# Patient Record
Sex: Male | Born: 1945 | Race: Black or African American | Hispanic: No | Marital: Married | Smoking: Current every day smoker
Health system: Southern US, Community
[De-identification: ages and names within clinical notes are randomized; demographics above are authoritative.]

## PROBLEM LIST (undated history)

## (undated) DIAGNOSIS — E785 Hyperlipidemia, unspecified: Secondary | ICD-10-CM

## (undated) DIAGNOSIS — M1990 Unspecified osteoarthritis, unspecified site: Secondary | ICD-10-CM

## (undated) DIAGNOSIS — M109 Gout, unspecified: Secondary | ICD-10-CM

## (undated) DIAGNOSIS — I519 Heart disease, unspecified: Secondary | ICD-10-CM

## (undated) DIAGNOSIS — I10 Essential (primary) hypertension: Secondary | ICD-10-CM

## (undated) DIAGNOSIS — F32A Depression, unspecified: Secondary | ICD-10-CM

## (undated) DIAGNOSIS — I639 Cerebral infarction, unspecified: Secondary | ICD-10-CM

## (undated) DIAGNOSIS — F419 Anxiety disorder, unspecified: Secondary | ICD-10-CM

## (undated) DIAGNOSIS — H409 Unspecified glaucoma: Secondary | ICD-10-CM

## (undated) DIAGNOSIS — F329 Major depressive disorder, single episode, unspecified: Secondary | ICD-10-CM

## (undated) HISTORY — DX: Anxiety disorder, unspecified: F41.9

## (undated) HISTORY — DX: Heart disease, unspecified: I51.9

## (undated) HISTORY — DX: Gout, unspecified: M10.9

## (undated) HISTORY — DX: Hyperlipidemia, unspecified: E78.5

## (undated) HISTORY — DX: Unspecified glaucoma: H40.9

---

## 1977-03-24 HISTORY — PX: LEG SURGERY: SHX1003

## 2005-11-09 ENCOUNTER — Emergency Department: Payer: Self-pay | Admitting: Emergency Medicine

## 2005-11-09 ENCOUNTER — Other Ambulatory Visit: Payer: Self-pay

## 2011-05-27 DIAGNOSIS — H2510 Age-related nuclear cataract, unspecified eye: Secondary | ICD-10-CM | POA: Insufficient documentation

## 2011-05-27 DIAGNOSIS — H401190 Primary open-angle glaucoma, unspecified eye, stage unspecified: Secondary | ICD-10-CM | POA: Insufficient documentation

## 2011-05-27 DIAGNOSIS — H409 Unspecified glaucoma: Secondary | ICD-10-CM | POA: Insufficient documentation

## 2012-11-03 DIAGNOSIS — I10 Essential (primary) hypertension: Secondary | ICD-10-CM | POA: Insufficient documentation

## 2012-11-03 DIAGNOSIS — E7800 Pure hypercholesterolemia, unspecified: Secondary | ICD-10-CM | POA: Insufficient documentation

## 2012-11-03 DIAGNOSIS — F329 Major depressive disorder, single episode, unspecified: Secondary | ICD-10-CM | POA: Insufficient documentation

## 2013-05-26 DIAGNOSIS — I639 Cerebral infarction, unspecified: Secondary | ICD-10-CM | POA: Insufficient documentation

## 2013-05-26 DIAGNOSIS — K219 Gastro-esophageal reflux disease without esophagitis: Secondary | ICD-10-CM | POA: Insufficient documentation

## 2013-10-17 DIAGNOSIS — G723 Periodic paralysis: Secondary | ICD-10-CM | POA: Insufficient documentation

## 2013-10-17 DIAGNOSIS — M7500 Adhesive capsulitis of unspecified shoulder: Secondary | ICD-10-CM | POA: Insufficient documentation

## 2013-10-17 DIAGNOSIS — Z8781 Personal history of (healed) traumatic fracture: Secondary | ICD-10-CM | POA: Insufficient documentation

## 2014-04-27 DIAGNOSIS — R350 Frequency of micturition: Secondary | ICD-10-CM | POA: Insufficient documentation

## 2015-02-22 ENCOUNTER — Encounter: Payer: Self-pay | Admitting: *Deleted

## 2015-02-22 ENCOUNTER — Emergency Department
Admission: EM | Admit: 2015-02-22 | Discharge: 2015-02-22 | Disposition: A | Discharge status: Home / Self Care | Payer: Medicare Other | Attending: Emergency Medicine | Admitting: Emergency Medicine

## 2015-02-22 DIAGNOSIS — R350 Frequency of micturition: Secondary | ICD-10-CM | POA: Diagnosis present

## 2015-02-22 DIAGNOSIS — R109 Unspecified abdominal pain: Secondary | ICD-10-CM | POA: Insufficient documentation

## 2015-02-22 DIAGNOSIS — R358 Other polyuria: Secondary | ICD-10-CM | POA: Diagnosis not present

## 2015-02-22 DIAGNOSIS — I10 Essential (primary) hypertension: Secondary | ICD-10-CM | POA: Diagnosis not present

## 2015-02-22 DIAGNOSIS — F17200 Nicotine dependence, unspecified, uncomplicated: Secondary | ICD-10-CM | POA: Insufficient documentation

## 2015-02-22 HISTORY — DX: Major depressive disorder, single episode, unspecified: F32.9

## 2015-02-22 HISTORY — DX: Essential (primary) hypertension: I10

## 2015-02-22 HISTORY — DX: Cerebral infarction, unspecified: I63.9

## 2015-02-22 HISTORY — DX: Depression, unspecified: F32.A

## 2015-02-22 HISTORY — DX: Unspecified osteoarthritis, unspecified site: M19.90

## 2015-02-22 LAB — BASIC METABOLIC PANEL
ANION GAP: 6 (ref 5–15)
BUN: 14 mg/dL (ref 6–20)
CALCIUM: 9.1 mg/dL (ref 8.9–10.3)
CO2: 25 mmol/L (ref 22–32)
Chloride: 112 mmol/L — ABNORMAL HIGH (ref 101–111)
Creatinine, Ser: 1.25 mg/dL — ABNORMAL HIGH (ref 0.61–1.24)
GFR calc non Af Amer: 57 mL/min — ABNORMAL LOW (ref >60)
GLUCOSE: 104 mg/dL — AB (ref 65–99)
POTASSIUM: 4 mmol/L (ref 3.5–5.1)
Sodium: 143 mmol/L (ref 135–145)

## 2015-02-22 LAB — CBC
HEMATOCRIT: 39.8 % — AB (ref 40.0–52.0)
HEMOGLOBIN: 13.2 g/dL (ref 13.0–18.0)
MCH: 29.5 pg (ref 26.0–34.0)
MCHC: 33.2 g/dL (ref 32.0–36.0)
MCV: 89 fL (ref 80.0–100.0)
Platelets: 207 10*3/uL (ref 150–440)
RBC: 4.47 MIL/uL (ref 4.40–5.90)
RDW: 13.8 % (ref 11.5–14.5)
WBC: 6.2 10*3/uL (ref 3.8–10.6)

## 2015-02-22 LAB — URINALYSIS COMPLETE WITH MICROSCOPIC (ARMC ONLY)
BACTERIA UA: NONE SEEN
Bilirubin Urine: NEGATIVE
GLUCOSE, UA: NEGATIVE mg/dL
Ketones, ur: NEGATIVE mg/dL
Leukocytes, UA: NEGATIVE
Nitrite: NEGATIVE
PH: 5 (ref 5.0–8.0)
SQUAMOUS EPITHELIAL / LPF: NONE SEEN
Specific Gravity, Urine: 1.02 (ref 1.005–1.030)

## 2015-02-22 NOTE — ED Notes (Signed)
Pt states having difficulty urinating with "dribbling" as well. Pt states seen prior at other hospitals but unsure of diagnosis.

## 2015-02-22 NOTE — ED Notes (Signed)
Right back pain, slight burning with urination

## 2015-02-22 NOTE — ED Provider Notes (Signed)
[facility] Emergency Department Provider Note  ____________________________________________  Time seen: Approximately 3:15 PM  I have reviewed the triage vital signs and the nursing notes.   HISTORY  Chief Complaint Urinary Frequency    HPI Maurice Sullivan is a 69 y.o. male with primary medical history that includes hypertension and 3 prior CVAs who presents with complaint of urinary frequency for several months.  He has been see his primary care doctor several times and they do not yet have a diagnosis.  He denies fever/chills, chest pain, shortness of breath, abdominal pain, dysuria.  He states that sometimes he dribbles a little bit.  He occasionally has some pain in his right flank which he describes as aching, mild, and intermittent.Thing seems to make his symptoms better and nothing makes them worse.   Past Medical History  Diagnosis Date  . Hypertension   . Depression   . CVA (cerebral infarction)   . Arthritis     There are no active problems to display for this patient.   History reviewed. No pertinent past surgical history.  No current outpatient prescriptions on file.  Allergies Review of patient's allergies indicates no known allergies.  No family history on file.  Social History Social History  Substance Use Topics  . Smoking status: Current Every Day Smoker  . Smokeless tobacco: None  . Alcohol Use: No    Review of Systems Constitutional: No fever/chills Eyes: No visual changes. ENT: No sore throat. Cardiovascular: Denies chest pain. Respiratory: Denies shortness of breath. Gastrointestinal: No abdominal pain with intermittent right-sided flank pain.  No nausea, no vomiting.  No diarrhea.  No constipation. Genitourinary: Negative for dysuria.  +Polyuria. Musculoskeletal: Negative for back pain. Skin: Negative for rash. Neurological: Negative for headaches, focal weakness or numbness.  10-point ROS otherwise  negative.  ____________________________________________   PHYSICAL EXAM:  VITAL SIGNS: ED Triage Vitals  Enc Vitals Group     BP 02/22/15 1233 172/46 mmHg     Pulse Rate 02/22/15 1233 76     Resp 02/22/15 1233 18     Temp 02/22/15 1233 97.8 F (36.6 C)     Temp Source 02/22/15 1233 Oral     SpO2 02/22/15 1233 99 %     Weight 02/22/15 1233 168 lb (76.204 kg)     Height 02/22/15 1233  (1.803 m)     Head Cir --      Peak Flow --      Pain Score 02/22/15 1238 5     Pain Loc --      Pain Edu? --      Excl. in GC? --     Constitutional: Alert and oriented. Well appearing and in no acute distress. Eyes: Conjunctivae are normal. PERRL. EOMI. Head: Atraumatic. Nose: No congestion/rhinnorhea. Mouth/Throat: Mucous membranes are moist.  Oropharynx non-erythematous. Neck: No stridor.   Cardiovascular: Normal rate, regular rhythm. Grossly normal heart sounds.  Good peripheral circulation. Respiratory: Normal respiratory effort.  No retractions. Lungs CTAB. Gastrointestinal: Soft and nontender. No distention. No abdominal bruits. No CVA tenderness. Musculoskeletal: No lower extremity tenderness nor edema.  No joint effusions. Neurologic:  Normal speech and language. No gross focal neurologic deficits are appreciated.  Skin:  Skin is warm, dry and intact. No rash noted. Psychiatric: Mood and affect are normal. Speech and behavior are normal.  ____________________________________________   LABS (all labs ordered are listed, but only abnormal results are displayed)  Labs Reviewed  CBC - Abnormal; Notable for the  following:    HCT 39.8 (*)    All other components within normal limits  BASIC METABOLIC PANEL - Abnormal; Notable for the following:    Chloride 112 (*)    Glucose, Bld 104 (*)    Creatinine, Ser 1.25 (*)    GFR calc non Af Amer 57 (*)    All other components within normal limits  URINALYSIS COMPLETEWITH MICROSCOPIC ([facility] ONLY) - Abnormal; Notable for the  following:    Color, Urine YELLOW (*)    APPearance CLEAR (*)    Hgb urine dipstick 1+ (*)    Protein, ur >500 (*)    All other components within normal limits   ____________________________________________  EKG  Not indicated ____________________________________________  RADIOLOGY   No results found.  ____________________________________________   PROCEDURES  Procedure(s) performed: None  Critical Care performed: No ____________________________________________   INITIAL IMPRESSION / ASSESSMENT AND PLAN / ED COURSE  Pertinent labs & imaging results that were available during my care of the patient were reviewed by me and considered in my medical decision making (see chart for details).  The patient is well-appearing and in no acute distress with normal vital signs except for hypertension.  His urinalysis is unremarkable except for small amount of hemoglobin but he has no red cells or white cells and no evidence of infection.  I discussed the patient's symptoms with him and his wife fairly extensively.  I offered imaging including a CT scan renal study protocol, but they are not interested today, and I agree it is not particularly necessary or indicated.  They are most interested in being able to follow up with the urologist so I provided them that contact information.  The patient also declined a rectal exam today.  Again I think this is appropriate as it is unlikely to change his diagnosis or disposition.  I gave my usual and customary return precautions.     ____________________________________________  FINAL CLINICAL IMPRESSION(S) / ED DIAGNOSES  Final diagnoses:  Urinary frequency      NEW MEDICATIONS STARTED DURING THIS VISIT:  New Prescriptions   No medications on file     Loleta Roseory Mailey Landstrom, MD 02/22/15 1623

## 2015-02-22 NOTE — Discharge Instructions (Signed)
As we discussed, your workup today was reassuring.  Though we do not know exactly what is causing your symptoms, it appears that you have no emergent medical condition at this time are safe to go home and follow up as recommended in this paperwork.  Please return immediately to the Emergency Department if you develop any new or worsening symptoms that concern you.   Urinary Frequency The number of times a normal person urinates depends upon how much liquid they take in and how much liquid they are losing. If the temperature is hot and there is high humidity, then the person will sweat more and usually breathe a little more frequently. These factors decrease the amount of frequency of urination that would be considered normal. The amount you drink is easily determined, but the amount of fluid lost is sometimes more difficult to calculate.  Fluid is lost in two ways:  Sensible fluid loss is usually measured by the amount of urine that you get rid of. Losses of fluid can also occur with diarrhea.  Insensible fluid loss is more difficult to measure. It is caused by evaporation. Insensible loss of fluid occurs through breathing and sweating. It usually ranges from a little less than a quart to a little more than a quart of fluid a day. In normal temperatures and activity levels, the average person may urinate 4 to 7 times in a 24-hour period. Needing to urinate more often than that could indicate a problem. If one urinates 4 to 7 times in 24 hours and has large volumes each time, that could indicate a different problem from one who urinates 4 to 7 times a day and has small volumes. The time of urinating is also important. Most urinating should be done during the waking hours. Getting up at night to urinate frequently can indicate some problems. CAUSES  The bladder is the organ in your lower abdomen that holds urine. Like a balloon, it swells some as it fills up. Your nerves sense this and tell you it is  time to head for the bathroom. There are a number of reasons that you might feel the need to urinate more often than usual. They include:  Urinary tract infection. This is usually associated with other signs such as burning when you urinate.  In men, problems with the prostate (a walnut-size gland that is located near the tube that carries urine out of your body). There are two reasons why the prostate can cause an increased frequency of urination:  An enlarged prostate that does not let the bladder empty well. If the bladder only half empties when you urinate, then it only has half the capacity to fill before you have to urinate again.  The nerves in the bladder become more hypersensitive with an increased size of the prostate even if the bladder empties completely.  Pregnancy.  Obesity. Excess weight is more likely to cause a problem for women than for men.  Bladder stones or other bladder problems.  Caffeine.  Alcohol.  Medications. For example, drugs that help the body get rid of extra fluid (diuretics) increase urine production. Some other medicines must be taken with lots of fluids.  Muscle or nerve weakness. This might be the result of a spinal cord injury, a stroke, multiple sclerosis, or Parkinson disease.  Long-standing diabetes can decrease the sensation of the bladder. This loss of sensation makes it harder to sense the bladder needs to be emptied. Over a period of years, the bladder is  stretched out by constant overfilling. This weakens the bladder muscles so that the bladder does not empty well and has less capacity to fill with new urine.  Interstitial cystitis (also called painful bladder syndrome). This condition develops because the tissues that line the inside of the bladder are inflamed (inflammation is the body's way of reacting to injury or infection). It causes pain and frequent urination. It occurs in women more often than in men. DIAGNOSIS   To decide what might  be causing your urinary frequency, your health care provider will probably:  Ask about symptoms you have noticed.  Ask about your overall health. This will include questions about any medications you are taking.  Do a physical examination.  Order some tests. These might include:  A blood test to check for diabetes or other health issues that could be contributing to the problem.  Urine testing. This could measure the flow of urine and the pressure on the bladder.  A test of your neurological system (the brain, spinal cord, and nerves). This is the system that senses the need to urinate.  A bladder test to check whether it is emptying completely when you urinate.  Cystoscopy. This test uses a thin tube with a tiny camera on it. It offers a look inside your urethra and bladder to see if there are problems.  Imaging tests. You might be given a contrast dye and then asked to urinate. X-rays are taken to see how your bladder is working. TREATMENT  It is important for you to be evaluated to determine if the amount or frequency that you have is unusual or abnormal. If it is found to be abnormal, the cause should be determined and this can usually be found out easily. Depending upon the cause, treatment could include medication, stimulation of the nerves, or surgery. There are not too many things that you can do as an individual to change your urinary frequency. It is important that you balance the amount of fluid intake needed to compensate for your activity and the temperature. Medical problems will be diagnosed and taken care of by your physician. There is no particular bladder training such as Kegel exercises that you can do to help urinary frequency. This is an exercise that is usually recommended for people who have leaking of urine when they laugh, cough, or sneeze. HOME CARE INSTRUCTIONS   Take any medications your health care provider prescribed or suggested. Follow the directions  carefully.  Practice any lifestyle changes that are recommended. These might include:  Drinking less fluid or drinking at different times of the day. If you need to urinate often during the night, for example, you may need to stop drinking fluids early in the evening.  Cutting down on caffeine or alcohol. They both can make you need to urinate more often than normal. Caffeine is found in coffee, tea, and sodas.  Losing weight, if that is recommended.  Keep a journal or a log. You might be asked to record how much you drink and when and where you feel the need to urinate. This will also help evaluate how well the treatment provided by your physician is working. SEEK MEDICAL CARE IF:   Your need to urinate often gets worse.  You feel increased pain or irritation when you urinate.  You notice blood in your urine.  You have questions about any medications that your health care provider recommended.  You notice blood, pus, or swelling at the site of any test  or treatment procedure.  You develop a fever of more than 100.74F (38.1C). SEEK IMMEDIATE MEDICAL CARE IF:  You develop a fever of more than 102.21F (38.9C).   This information is not intended to replace advice given to you by your health care provider. Make sure you discuss any questions you have with your health care provider.   Document Released: 01/04/2009 Document Revised: 03/31/2014 Document Reviewed: 01/04/2009 Elsevier Interactive Patient Education [employer]

## 2015-03-05 ENCOUNTER — Encounter: Payer: Self-pay | Admitting: Urology

## 2015-03-05 ENCOUNTER — Ambulatory Visit (INDEPENDENT_AMBULATORY_CARE_PROVIDER_SITE_OTHER): Payer: Medicare Other | Admitting: Urology

## 2015-03-05 VITALS — BP 190/64 | HR 71 | Ht 71.0 in | Wt 162.7 lb

## 2015-03-05 DIAGNOSIS — R3915 Urgency of urination: Secondary | ICD-10-CM

## 2015-03-05 DIAGNOSIS — Z125 Encounter for screening for malignant neoplasm of prostate: Secondary | ICD-10-CM

## 2015-03-05 DIAGNOSIS — N400 Enlarged prostate without lower urinary tract symptoms: Secondary | ICD-10-CM

## 2015-03-05 DIAGNOSIS — R350 Frequency of micturition: Secondary | ICD-10-CM

## 2015-03-05 LAB — URINALYSIS, COMPLETE
BILIRUBIN UA: NEGATIVE
GLUCOSE, UA: NEGATIVE
KETONES UA: NEGATIVE
Leukocytes, UA: NEGATIVE
NITRITE UA: NEGATIVE
UUROB: 1 mg/dL (ref 0.2–1.0)
pH, UA: 5 (ref 5.0–7.5)

## 2015-03-05 LAB — MICROSCOPIC EXAMINATION: EPITHELIAL CELLS (NON RENAL): NONE SEEN /HPF (ref 0–10)

## 2015-03-05 LAB — BLADDER SCAN AMB NON-IMAGING

## 2015-03-05 MED ORDER — TAMSULOSIN HCL 0.4 MG PO CAPS: 0.4000 mg | ORAL_CAPSULE | Freq: Every day | ORAL | Status: DC

## 2015-03-05 NOTE — Progress Notes (Signed)
03/05/2015 6:27 PM   Maurice Sullivan Aug 08, 1945 [id number]  Referring provider: Ellery Plunk, MD [address] [facility] [address] [place], [place] [address]  Chief Complaint  Patient presents with  . Urinary Frequency    New Patient    HPI: 69 yo M referred from the ED for worsening urinary symptoms.  Patient reports a 3 month history of worsening urinary symptoms. Prior to 3 months ago he had very few urinary issues. Over the past 3 months, he is developed urinary frequency, nocturia 5, difficulty emptying his bladder, post void dribbling. He is seen several doctors including his PCP and the emergency room. His urine has been tested for urinary tract infection although these tests were negative.  He also endorses today episodes of urge incontinence as well as lower abdominal pain.  No gross hematuria. No dysuria.  He is not currently on any medications for his prostate.  He has not had any PSA/ DRE per the patient by his PCP.  No recent PSA data and care everywhere.  No family history of prostate cancer.  He does have multiple medical problems including history of tobacco abuse and stroke.  Post void residual residual today is minimal.       IPSS      03/05/15 1500       International Prostate Symptom Score   How often have you had the sensation of not emptying your bladder? Almost always     How often have you had to urinate less than every two hours? Almost always     How often have you found you stopped and started again several times when you urinated? More than half the time     How often have you found it difficult to postpone urination? Almost always     How often have you had a weak urinary stream? Almost always     How often have you had to strain to start urination? Almost always     How many times did you typically get up at night to urinate? 5 Times     Total IPSS Score 34     Quality of Life due to urinary symptoms   If you  were to spend the rest of your life with your urinary condition just the way it is now how would you feel about that? Terrible         PMH: Past Medical History  Diagnosis Date  . Hypertension   . Depression   . CVA (cerebral infarction)   . Arthritis   . Anxiety   . Glaucoma   . Gout   . Heart disease   . Hyperlipemia     Surgical History: Past Surgical History  Procedure Laterality Date  . Leg surgery  1979    Home Medications:    Medication List       This list is accurate as of: 03/05/15  6:27 PM.  Always use your most recent med list.               lisinopril 10 MG tablet  Commonly known as:  PRINIVIL,ZESTRIL  Take 10 mg by mouth daily.     metoprolol tartrate 25 MG tablet  Commonly known as:  LOPRESSOR  Take 25 mg by mouth 2 (two) times daily.     pravastatin 20 MG tablet  Commonly known as:  PRAVACHOL  Take 20 mg by mouth daily.     sertraline 100 MG tablet  Commonly  known as:  ZOLOFT  Take 100 mg by mouth daily.     tamsulosin 0.4 MG Caps capsule  Commonly known as:  FLOMAX  Take 1 capsule (0.4 mg total) by mouth daily.        Allergies: No Known Allergies  Family History: Family History  Problem Relation Age of Onset  . Prostate cancer Brother     Social History:  reports that he has been smoking Cigarettes.  He has been smoking about 1.00 pack per day. He does not have any smokeless tobacco history on file. He reports that he drinks alcohol. He reports that he does not use illicit drugs.  ROS: UROLOGY Frequent Urination?: Yes Hard to postpone urination?: Yes Burning/pain with urination?: Yes Get up at night to urinate?: Yes Leakage of urine?: Yes Urine stream starts and stops?: Yes Trouble starting stream?: Yes Do you have to strain to urinate?: Yes Blood in urine?: No Urinary tract infection?: No Sexually transmitted disease?: No Injury to kidneys or bladder?: No Painful intercourse?: No Weak stream?: Yes Erection  problems?: Yes Penile pain?: No  Gastrointestinal Nausea?: No Vomiting?: No Indigestion/heartburn?: No Diarrhea?: No Constipation?: No  Constitutional Fever: No Night sweats?: Yes Weight loss?: Yes Fatigue?: Yes  Skin Skin rash/lesions?: No Itching?: No  Eyes Blurred vision?: Yes Double vision?: No  Ears/Nose/Throat Sore throat?: Yes Sinus problems?: No  Hematologic/Lymphatic Swollen glands?: No Easy bruising?: No  Cardiovascular Leg swelling?: No Chest pain?: No  Respiratory Cough?: No Shortness of breath?: No  Endocrine Excessive thirst?: Yes  Musculoskeletal Back pain?: Yes Joint pain?: No  Neurological Headaches?: Yes Dizziness?: No  Psychologic Depression?: Yes Anxiety?: Yes  Physical Exam: BP 190/64 mmHg  Pulse 71  Ht  (1.803 m)  Wt 162 lb 11.2 oz (73.8 kg)  BMI 22.70 kg/m2  Constitutional:  Alert and oriented, No acute distress. HEENT: [place] AT, moist mucus membranes.  Trachea midline, no masses. Cardiovascular: No clubbing, cyanosis, or edema. Respiratory: Normal respiratory effort, no increased work of breathing. GI: Abdomen is soft, nontender, nondistended, no abdominal masses GU: No CVA tenderness. Circumcised phallus with orthotopic meatus.  Right testicle absent, left testicle present, no masses. Unremarkable scrotum. Rectal exam: Normal sphincter tone. Enlarged 50+ cc prostate, no masses, nontender. Skin: No rashes, bruises or suspicious lesions. Lymph: No cervical or inguinal adenopathy. Neurologic: Grossly intact, no focal deficits, moving all 4 extremities. Psychiatric: Normal mood and affect.  Laboratory Data: Lab Results  Component Value Date   WBC 6.2 02/22/2015   HGB 13.2 02/22/2015   HCT 39.8* 02/22/2015   MCV 89.0 02/22/2015   PLT 207 02/22/2015    Lab Results  Component Value Date   CREATININE 1.25* 02/22/2015     Urinalysis UA today Shows 1+ blood, 3+ protein otherwise negative. Microscopic exam  shows no white blood cells or red blood cells. There is mucus present. Few bacteria. No suspicion for infection.  Pertinent Imaging: Results for orders placed or performed in visit on 03/05/15  Microscopic Examination  Result Value Ref Range   WBC, UA 0-5 0 -  5 /hpf   RBC, UA 0-2 0 -  2 /hpf   Epithelial Cells (non renal) None seen 0 - 10 /hpf   Mucus, UA Present (A) Not Estab.   Bacteria, UA Few (A) None seen/Few  Urinalysis, Complete  Result Value Ref Range   Specific Gravity, UA >1.030 (H) 1.005 - 1.030   pH, UA 5.0 5.0 - 7.5   Color, UA Yellow Yellow  Appearance Ur Clear Clear   Leukocytes, UA Negative Negative   Protein, UA 3+ (A) Negative/Trace   Glucose, UA Negative Negative   Ketones, UA Negative Negative   RBC, UA 1+ (A) Negative   Bilirubin, UA Negative Negative   Urobilinogen, Ur 1.0 0.2 - 1.0 mg/dL   Nitrite, UA Negative Negative   Microscopic Examination See below:   BLADDER SCAN AMB NON-IMAGING  Result Value Ref Range   Scan Result 0ml     Assessment & Plan:   69 year old male with worsening urinary symptoms including urinary urgency, frequency, and urge incontinence. He also has a known large gland on exam today consistent with BPH.  I will like to go ahead and start him on Flomax to start and have him return in approximately 6 weeks for reassessment. At that point, may consider addition of anticholinergic medication. He is at risk for OAB given his history of stroke.  No evidence of the UTI on UA today.  1. Urinary frequency - Urinalysis, Complete - BLADDER SCAN AMB NON-IMAGING - PSA  2. Urinary urgency - Urinalysis, Complete - BLADDER SCAN AMB NON-IMAGING  3. BPH (benign prostatic hyperplasia)  4. Prostate cancer screening Discussed risk and benefits of prostate cancer screening. Given his current worsening of symptoms, D fields. To rule out clinically significant prostate cancer. I advised him no further screening should this PSA returned negative  given his age and comorbidities.   Return in about 6 weeks (around 04/16/2015) for IPSS, symptoms recheck.  Vanna ScotlandAshley Etha Stambaugh, MD  [facility] [address], [address] [place], [place] [address] [phone]

## 2015-03-06 LAB — PSA: PROSTATE SPECIFIC AG, SERUM: 1.1 ng/mL (ref 0.0–4.0)

## 2015-03-07 ENCOUNTER — Telehealth: Payer: Self-pay

## 2015-03-07 NOTE — Telephone Encounter (Signed)
Spoke with pt wife in reference to PSA. Wife voiced understanding.

## 2015-03-07 NOTE — Telephone Encounter (Signed)
-----   Message from Ashley BrandonVanna Scotland, MD sent at 03/06/2015  5:24 PM EST ----- Please let this patient noted that his PSA is excellent, 1.1. I would not recommend any further PSAs.  Vanna ScotlandAshley Brandon, MD

## 2015-04-19 ENCOUNTER — Ambulatory Visit: Payer: Medicare Other | Admitting: Urology

## 2015-05-01 ENCOUNTER — Ambulatory Visit: Payer: Medicare Other | Admitting: Urology

## 2015-05-04 ENCOUNTER — Ambulatory Visit (INDEPENDENT_AMBULATORY_CARE_PROVIDER_SITE_OTHER): Payer: Medicare Other | Admitting: Urology

## 2015-05-04 ENCOUNTER — Encounter: Payer: Self-pay | Admitting: Urology

## 2015-05-04 VITALS — BP 175/71 | HR 69 | Ht 71.0 in | Wt 162.1 lb

## 2015-05-04 DIAGNOSIS — R351 Nocturia: Secondary | ICD-10-CM

## 2015-05-04 DIAGNOSIS — R350 Frequency of micturition: Secondary | ICD-10-CM

## 2015-05-04 DIAGNOSIS — R3915 Urgency of urination: Secondary | ICD-10-CM

## 2015-05-04 DIAGNOSIS — N400 Enlarged prostate without lower urinary tract symptoms: Secondary | ICD-10-CM

## 2015-05-04 LAB — URINALYSIS, COMPLETE
BILIRUBIN UA: NEGATIVE
Glucose, UA: NEGATIVE
LEUKOCYTES UA: NEGATIVE
Nitrite, UA: NEGATIVE
PH UA: 6 (ref 5.0–7.5)
Specific Gravity, UA: >1.03 — ABNORMAL HIGH (ref 1.005–1.030)
Urobilinogen, Ur: 1 mg/dL (ref 0.2–1.0)

## 2015-05-04 LAB — MICROSCOPIC EXAMINATION
Bacteria, UA: NONE SEEN
RENAL EPITHEL UA: NONE SEEN /HPF
WBC, UA: NONE SEEN /hpf (ref 0–?)

## 2015-05-04 LAB — BLADDER SCAN AMB NON-IMAGING: Scan Result: 77

## 2015-05-04 NOTE — Progress Notes (Signed)
2:41 PM  05/04/2015   Maurice Sullivan 1945-05-06 [id number]  Referring provider: Ellery Plunk, MD [address] [place], [place] [address]  Chief Complaint  Patient presents with  . Benign Prostatic Hypertrophy    6wk    HPI: 70 yo M with LUTS x 3 months.  He was started on Flomax last visit (2 months ago) which helped some with the frequency but not with the urgency or urge incontinence.   He continues to get up 6x nightly  His flow is slightly better as well.  His lower abdominal pain has also resolved.  Overall, he is still is quite unhappy.   No dysuria or gross hematuria.  He does think his urine is somewhat foul smelling.    Rectal exam enlarged last visit, PSA 1.1 on 03/06/15.  He does have multiple medical problems including history of tobacco abuse and stroke.  Post void residual today 77.         IPSS      03/05/15 1500 05/04/15 1300     International Prostate Symptom Score   How often have you had the sensation of not emptying your bladder? Almost always Almost always    How often have you had to urinate less than every two hours? Almost always About half the time    How often have you found you stopped and started again several times when you urinated? More than half the time About half the time    How often have you found it difficult to postpone urination? Almost always Almost always    How often have you had a weak urinary stream? Almost always Almost always    How often have you had to strain to start urination? Almost always Almost always    How many times did you typically get up at night to urinate? 5 Times 5 Times    Total IPSS Score 34 31    Quality of Life due to urinary symptoms   If you were to spend the rest of your life with your urinary condition just the way it is now how would you feel about that? Terrible Mostly Disatisfied        PMH: Past Medical History  Diagnosis Date  . Hypertension   . Depression   . CVA  (cerebral infarction)   . Arthritis   . Anxiety   . Glaucoma   . Gout   . Heart disease   . Hyperlipemia     Surgical History: Past Surgical History  Procedure Laterality Date  . Leg surgery  1979    Home Medications:    Medication List       This list is accurate as of: 05/04/15  2:41 PM.  Always use your most recent med list.               aspirin 81 MG chewable tablet  Chew by mouth.     lisinopril 10 MG tablet  Commonly known as:  PRINIVIL,ZESTRIL  Take 10 mg by mouth daily.     metoprolol tartrate 25 MG tablet  Commonly known as:  LOPRESSOR  Take 25 mg by mouth 2 (two) times daily.     pravastatin 20 MG tablet  Commonly known as:  PRAVACHOL  Take 20 mg by mouth daily.     sertraline 100 MG tablet  Commonly known as:  ZOLOFT  Take 100 mg by mouth daily.     tamsulosin 0.4 MG Caps capsule  Commonly  known as:  FLOMAX  Take 1 capsule (0.4 mg total) by mouth daily.        Allergies: No Known Allergies  Family History: Family History  Problem Relation Age of Onset  . Prostate cancer Brother     Social History:  reports that he has been smoking Cigarettes.  He has been smoking about 1.00 pack per day. He does not have any smokeless tobacco history on file. He reports that he drinks alcohol. He reports that he does not use illicit drugs.  ROS: UROLOGY Frequent Urination?: Yes Hard to postpone urination?: Yes Burning/pain with urination?: No Get up at night to urinate?: Yes Leakage of urine?: Yes Urine stream starts and stops?: Yes Trouble starting stream?: Yes Do you have to strain to urinate?: No Blood in urine?: No Urinary tract infection?: No Sexually transmitted disease?: No Injury to kidneys or bladder?: No Painful intercourse?: No Weak stream?: No Erection problems?: Yes Penile pain?: No  Gastrointestinal Nausea?: No Vomiting?: No Indigestion/heartburn?: No Diarrhea?: No Constipation?: Yes  Constitutional Fever: No Night  sweats?: Yes Weight loss?: No Fatigue?: Yes  Skin Skin rash/lesions?: No Itching?: No  Eyes Blurred vision?: Yes Double vision?: No  Ears/Nose/Throat Sore throat?: No Sinus problems?: No  Hematologic/Lymphatic Swollen glands?: No Easy bruising?: No  Cardiovascular Leg swelling?: No Chest pain?: No  Respiratory Cough?: Yes Shortness of breath?: No  Endocrine Excessive thirst?: No  Musculoskeletal Back pain?: No Joint pain?: No  Neurological Headaches?: No Dizziness?: No  Psychologic Depression?: Yes Anxiety?: No  Physical Exam: BP 175/71 mmHg  Pulse 69  Ht  (1.803 m)  Wt 162 lb 1.6 oz (73.528 kg)  BMI 22.62 kg/m2  Constitutional:  Alert and oriented, No acute distress. HEENT: [place] AT, moist mucus membranes.  Trachea midline, no masses. Cardiovascular: No clubbing, cyanosis, or edema. Respiratory: Normal respiratory effort, no increased work of breathing. GI: Abdomen is soft, nontender, nondistended, no abdominal masses Skin: No rashes, bruises or suspicious lesions. Neurologic: Grossly intact, no focal deficits, moving all 4 extremities. Psychiatric: Normal mood and affect.  Laboratory Data: Lab Results  Component Value Date   WBC 6.2 02/22/2015   HGB 13.2 02/22/2015   HCT 39.8* 02/22/2015   MCV 89.0 02/22/2015   PLT 207 02/22/2015    Lab Results  Component Value Date   CREATININE 1.25* 02/22/2015    Urinalysis UA reviewed, 3+ prot,1+ ket, trace blod.  Micro negative other than 3-10 RBC/ HFP.  Pertinent Imaging: Results for orders placed or performed in visit on 05/04/15  BLADDER SCAN AMB NON-IMAGING  Result Value Ref Range   Scan Result 77     Assessment & Plan:     1. Urinary frequency On Flomax Will add vesicare 10 mg (samples give x 2 weeks), advised to call if effective and will prescribe or adjust as needed Reviewed common side effects including dry eyes, dry mouth, and constipation along with retention - Urinalysis,  Complete - BLADDER SCAN AMB NON-IMAGING  2. Urinary urgency As above  3. BPH (benign prostatic hyperplasia) On Flomax S/p PCa screening 02/2015   4. Microscopic hematuria Incidental 3-10 RBC today in absence of infection Will recheck in 3 months to assess need for work up  Return in about 3 months (around 08/01/2015) for PVR, recheck symptoms.  Vanna Scotland, MD  [facility] [address] [place], [place] [address] [phone]

## 2015-05-15 ENCOUNTER — Telehealth: Payer: Self-pay | Admitting: Urology

## 2015-05-15 NOTE — Telephone Encounter (Signed)
Patient's wife called and said that her husband was still dribbling a little since he has been on the vesicare you gave him.   Just wanted you to know.  Thanks,  [name]

## 2015-06-28 DIAGNOSIS — N138 Other obstructive and reflux uropathy: Secondary | ICD-10-CM | POA: Insufficient documentation

## 2015-06-28 DIAGNOSIS — N401 Enlarged prostate with lower urinary tract symptoms: Principal | ICD-10-CM

## 2015-07-27 ENCOUNTER — Telehealth: Payer: Self-pay

## 2015-07-27 NOTE — Telephone Encounter (Signed)
Pt wife called stating pt does not have a f/u appt until June and wanted to try the vesicare again. Vesicare 10mg  samples were provided as per Dr. Apolinar JunesBrandon dictation.

## 2015-08-03 ENCOUNTER — Ambulatory Visit: Payer: Medicare Other | Admitting: Urology

## 2015-08-24 ENCOUNTER — Ambulatory Visit (INDEPENDENT_AMBULATORY_CARE_PROVIDER_SITE_OTHER): Payer: Medicare Other | Admitting: Urology

## 2015-08-24 ENCOUNTER — Encounter: Payer: Self-pay | Admitting: Urology

## 2015-08-24 VITALS — BP 152/68 | HR 78 | Ht 70.0 in | Wt 152.0 lb

## 2015-08-24 DIAGNOSIS — N529 Male erectile dysfunction, unspecified: Secondary | ICD-10-CM

## 2015-08-24 DIAGNOSIS — N138 Other obstructive and reflux uropathy: Secondary | ICD-10-CM

## 2015-08-24 DIAGNOSIS — R350 Frequency of micturition: Secondary | ICD-10-CM

## 2015-08-24 DIAGNOSIS — N401 Enlarged prostate with lower urinary tract symptoms: Secondary | ICD-10-CM

## 2015-08-24 DIAGNOSIS — N3281 Overactive bladder: Secondary | ICD-10-CM | POA: Diagnosis not present

## 2015-08-24 DIAGNOSIS — R3129 Other microscopic hematuria: Secondary | ICD-10-CM

## 2015-08-24 LAB — URINALYSIS, COMPLETE
Bilirubin, UA: NEGATIVE
Glucose, UA: NEGATIVE
Ketones, UA: NEGATIVE
Leukocytes, UA: NEGATIVE
Nitrite, UA: NEGATIVE
PH UA: 5 (ref 5.0–7.5)
Specific Gravity, UA: 1.025 (ref 1.005–1.030)
UUROB: 0.2 mg/dL (ref 0.2–1.0)

## 2015-08-24 LAB — MICROSCOPIC EXAMINATION: BACTERIA UA: NONE SEEN

## 2015-08-24 LAB — BLADDER SCAN AMB NON-IMAGING: SCAN RESULT: 20

## 2015-08-24 MED ORDER — SILDENAFIL CITRATE 100 MG PO TABS: 20.0000 mg | ORAL_TABLET | Freq: Every day | ORAL | Status: AC | PRN

## 2015-08-24 NOTE — Progress Notes (Signed)
3:09 PM  08/24/2015   Maurice Sullivan 1945/08/27 [id number]  Referring provider: Ellery PlunkKathleen K Barnhouse, MD [address] [place], [place] [id number]  Chief Complaint  Patient presents with  . Follow-up    BPH, microheamturia    HPI: 70 yo M with LUTS, OAB, h/o make a scopic hematuria 1 and ED.  BPH with LUTS/ OAB/ Urge Inconintence He was started on Flomax last visit on 12/16  which helped some with the frequency but not with the urgency or urge incontinence but continued to have nocturia x 6.  He was started on Vesicare 10 mg daily in February.  He continues to complain of difficulty postponing urination with urge incontinence x 3 daily  persistent nocturia 5,  and leakage of urine after voiding.   He does feel like the medication is helped slightly but not significantly.   No dysuria or gross hematuria. No UTIs.  His wife continues to report that his urine is foul smelling.  Rectal exam enlarged, PSA 1.1 on 03/06/15.  He does have multiple medical problems including history of tobacco abuse and stroke.  Post void residual today 20.    History of microscopic blood x 1 UA today negative for microscopic blood today.  It has been positive on 1 single occation  ED   He does have ED with difficulty maintaining and achieving an erection. He's never tried any PDE 5 inhibitors. He is interested in pursuing this. He has no troubles with orgasm or ejaculation. He has no contraindications for this medication.    PMH: Past Medical History  Diagnosis Date  . Hypertension   . Depression   . CVA (cerebral infarction)   . Arthritis   . Anxiety   . Glaucoma   . Gout   . Heart disease   . Hyperlipemia     Surgical History: Past Surgical History  Procedure Laterality Date  . Leg surgery  1979    Home Medications:    Medication List       This list is accurate as of: 08/24/15  3:09 PM.  Always use your most recent med list.               aspirin 81 MG  chewable tablet  Chew by mouth.     dorzolamide-timolol 22.3-6.8 MG/ML ophthalmic solution  Commonly known as:  COSOPT     lisinopril 10 MG tablet  Commonly known as:  PRINIVIL,ZESTRIL  Take 10 mg by mouth daily.     metoprolol tartrate 25 MG tablet  Commonly known as:  LOPRESSOR  Take 25 mg by mouth 2 (two) times daily.     pravastatin 20 MG tablet  Commonly known as:  PRAVACHOL  Take 20 mg by mouth daily.     sildenafil 100 MG tablet  Commonly known as:  VIAGRA  Take 0.5 tablets (50 mg total) by mouth daily as needed for erectile dysfunction (Take 1-5 tablets as needed 1 hour prior to sexual activity).     solifenacin 10 MG tablet  Commonly known as:  VESICARE  Take 10 mg by mouth daily.        Allergies: No Known Allergies  Family History: Family History  Problem Relation Age of Onset  . Prostate cancer Brother     Social History:  reports that he has been smoking Cigarettes.  He has been smoking about 1.00 pack per day. He does not have any smokeless tobacco history on file. He reports that he drinks  alcohol. He reports that he does not use illicit drugs.  ROS: UROLOGY Frequent Urination?: No Hard to postpone urination?: Yes Burning/pain with urination?: No Get up at night to urinate?: Yes Leakage of urine?: Yes Urine stream starts and stops?: No Trouble starting stream?: No Do you have to strain to urinate?: No Blood in urine?: No Urinary tract infection?: Yes Sexually transmitted disease?: No Injury to kidneys or bladder?: No Painful intercourse?: No Weak stream?: No Erection problems?: No Penile pain?: No  Gastrointestinal Nausea?: No Vomiting?: No Indigestion/heartburn?: No Diarrhea?: No Constipation?: No  Constitutional Fever: No Night sweats?: No Weight loss?: No Fatigue?: No  Skin Skin rash/lesions?: No Itching?: No  Eyes Blurred vision?: No Double vision?: No  Ears/Nose/Throat Sore throat?: No Sinus problems?:  No  Hematologic/Lymphatic Swollen glands?: No Easy bruising?: No  Cardiovascular Leg swelling?: No Chest pain?: No  Respiratory Cough?: No Shortness of breath?: No  Endocrine Excessive thirst?: No  Musculoskeletal Back pain?: No Joint pain?: No  Neurological Headaches?: No Dizziness?: No  Psychologic Depression?: No Anxiety?: No  Physical Exam: BP 152/68 mmHg  Pulse 78  Ht  (1.778 m)  Wt 152 lb (68.947 kg)  BMI 21.81 kg/m2  Constitutional:  Alert and oriented, No acute distress.  Company by his wife today. HEENT: [place] AT, moist mucus membranes.  Trachea midline, no masses. Cardiovascular: No clubbing, cyanosis, or edema. Respiratory: Normal respiratory effort, no increased work of breathing. GI: Abdomen is soft, nontender, nondistended, no abdominal masses Skin: No rashes, bruises or suspicious lesions. Neurologic: Grossly intact, no focal deficits, moving all 4 extremities. Psychiatric: Normal mood and affect.  Laboratory Data: Lab Results  Component Value Date   WBC 6.2 02/22/2015   HGB 13.2 02/22/2015   HCT 39.8* 02/22/2015   MCV 89.0 02/22/2015   PLT 207 02/22/2015    Lab Results  Component Value Date   CREATININE 1.25* 02/22/2015    Urinalysis UA reviewed, negative for any blood, WBCs, or bacteria. Nitrate negative. 2+ protein.  Pertinent Imaging: Results for orders placed or performed in visit on 08/24/15  Bladder Scan (Post Void Residual) in office  Result Value Ref Range   Scan Result 20     Assessment & Plan:     1. Urinary frequency Today's to have bladder overactivity including daytime urge incontinence and severe nocturia. Will change medication to Mybetriq 25 mg. 2 weeks' worth of samples given today. Patient advised to call if this is not effective. May increase dose to 50 mg.  If no affect, we'll need to see in the office sooner. - Urinalysis, Complete - BLADDER SCAN AMB NON-IMAGING  2. Urinary urgency As above  3.  BPH (benign prostatic hyperplasia) S/p PCa screening 02/2015   Symptoms primarily irritative  4. Microscopic hematuria Incidental 3-10 RBC today in absence of infection in 04/2014 Does not yet meet the criteria for microscopic work up, will continue to follow  5. ED  Prescribed Viagra 20 mg, we'll start with one tablet and increased up to 5 tablets as needed Discuss common side effects of medication and how to use this appropriately  Return in about 6 months (around 02/23/2016) for PVR/ IPSS/ UA.  Vanna Scotland, MD  [facility] [address], [address] [place], [place] [address] [phone]  I spent 25 min with this patient of which greater than 50% was spent in counseling and coordination of care with the patient.

## 2016-02-22 ENCOUNTER — Ambulatory Visit: Payer: Medicare Other | Admitting: Urology

## 2017-03-12 ENCOUNTER — Emergency Department: Payer: Medicare Other

## 2017-03-12 ENCOUNTER — Emergency Department
Admission: EM | Admit: 2017-03-12 | Discharge: 2017-03-12 | Disposition: A | Discharge status: Home / Self Care | Payer: Medicare Other | Attending: Emergency Medicine | Admitting: Emergency Medicine

## 2017-03-12 ENCOUNTER — Other Ambulatory Visit: Payer: Self-pay

## 2017-03-12 ENCOUNTER — Encounter: Payer: Self-pay | Admitting: Emergency Medicine

## 2017-03-12 DIAGNOSIS — I10 Essential (primary) hypertension: Secondary | ICD-10-CM | POA: Insufficient documentation

## 2017-03-12 DIAGNOSIS — F17210 Nicotine dependence, cigarettes, uncomplicated: Secondary | ICD-10-CM | POA: Diagnosis not present

## 2017-03-12 DIAGNOSIS — Z79899 Other long term (current) drug therapy: Secondary | ICD-10-CM | POA: Diagnosis not present

## 2017-03-12 DIAGNOSIS — R42 Dizziness and giddiness: Secondary | ICD-10-CM | POA: Insufficient documentation

## 2017-03-12 DIAGNOSIS — Z7982 Long term (current) use of aspirin: Secondary | ICD-10-CM | POA: Insufficient documentation

## 2017-03-12 DIAGNOSIS — Z8673 Personal history of transient ischemic attack (TIA), and cerebral infarction without residual deficits: Secondary | ICD-10-CM | POA: Diagnosis not present

## 2017-03-12 LAB — URINALYSIS, COMPLETE (UACMP) WITH MICROSCOPIC
Bacteria, UA: NONE SEEN
Bilirubin Urine: NEGATIVE
Glucose, UA: NEGATIVE mg/dL
Ketones, ur: NEGATIVE mg/dL
Leukocytes, UA: NEGATIVE
Nitrite: NEGATIVE
SQUAMOUS EPITHELIAL / LPF: NONE SEEN
Specific Gravity, Urine: 1.02 (ref 1.005–1.030)
pH: 5 (ref 5.0–8.0)

## 2017-03-12 LAB — CBC WITH DIFFERENTIAL/PLATELET
Basophils Absolute: 0.1 10*3/uL (ref 0–0.1)
Basophils Relative: 1 %
EOS ABS: 0.2 10*3/uL (ref 0–0.7)
Eosinophils Relative: 2 %
HCT: 37.8 % — ABNORMAL LOW (ref 40.0–52.0)
HEMOGLOBIN: 13 g/dL (ref 13.0–18.0)
LYMPHS ABS: 2.4 10*3/uL (ref 1.0–3.6)
Lymphocytes Relative: 30 %
MCH: 30.2 pg (ref 26.0–34.0)
MCHC: 34.5 g/dL (ref 32.0–36.0)
MCV: 87.6 fL (ref 80.0–100.0)
MONOS PCT: 7 %
Monocytes Absolute: 0.6 10*3/uL (ref 0.2–1.0)
NEUTROS PCT: 60 %
Neutro Abs: 4.7 10*3/uL (ref 1.4–6.5)
Platelets: 289 10*3/uL (ref 150–440)
RBC: 4.31 MIL/uL — ABNORMAL LOW (ref 4.40–5.90)
RDW: 14.5 % (ref 11.5–14.5)
WBC: 7.8 10*3/uL (ref 3.8–10.6)

## 2017-03-12 LAB — TROPONIN I: Troponin I: <0.03 ng/mL (ref <0.03)

## 2017-03-12 LAB — BASIC METABOLIC PANEL
ANION GAP: 5 (ref 5–15)
BUN: 23 mg/dL — ABNORMAL HIGH (ref 6–20)
CALCIUM: 9.1 mg/dL (ref 8.9–10.3)
CO2: 28 mmol/L (ref 22–32)
CREATININE: 1.86 mg/dL — AB (ref 0.61–1.24)
Chloride: 112 mmol/L — ABNORMAL HIGH (ref 101–111)
GFR calc Af Amer: 40 mL/min — ABNORMAL LOW (ref >60)
GFR calc non Af Amer: 35 mL/min — ABNORMAL LOW (ref >60)
GLUCOSE: 119 mg/dL — AB (ref 65–99)
Potassium: 4.3 mmol/L (ref 3.5–5.1)
Sodium: 145 mmol/L (ref 135–145)

## 2017-03-12 LAB — GLUCOSE, CAPILLARY: GLUCOSE-CAPILLARY: 114 mg/dL — AB (ref 65–99)

## 2017-03-12 MED ORDER — METOPROLOL TARTRATE 25 MG PO TABS
25.0000 mg | ORAL_TABLET | Freq: Once | ORAL | Status: AC
  Administered 2017-03-12: 25 mg via ORAL
  Filled 2017-03-12: qty 1

## 2017-03-12 MED ORDER — LISINOPRIL 10 MG PO TABS
10.0000 mg | ORAL_TABLET | Freq: Once | ORAL | Status: AC
  Administered 2017-03-12: 10 mg via ORAL
  Filled 2017-03-12: qty 1

## 2017-03-12 MED ORDER — SODIUM CHLORIDE 0.9 % IV BOLUS (SEPSIS)
500.0000 mL | Freq: Once | INTRAVENOUS | Status: AC
  Administered 2017-03-12: 500 mL via INTRAVENOUS

## 2017-03-12 NOTE — ED Provider Notes (Addendum)
[facility] Emergency Department Provider Note ____________________________________________   I have reviewed the triage vital signs and the triage nursing note.  HISTORY  Chief Complaint Drooling and Dizziness   Historian Patient and wife  HPI Maurice Sullivan is a 71 y.o. male came in for evaluation due to dizziness.  Wife and patient state he was fine last night when he went to bed.  This morning his wife states he took a little longer to get out of bed than usual, and when he did finally get up he stated that he was dizzy once he got up.  He describes dizziness as lightheadedness or feeling like he might pass out rather than any room spinning.  There is no focal weakness or numbness or confusion.  Wife noted that he seemed to be drooling a little bit out of his right side lip, although no noted facial drooping or slurred speech.  Patient does have a history of glaucoma and poor vision at baseline.  Reports history of prior stroke, wife states that he had left-sided weakness at that time, patient states he thinks he had dizziness at that time.    Past Medical History:  Diagnosis Date  . Anxiety   . Arthritis   . CVA (cerebral infarction)   . Depression   . Glaucoma   . Gout   . Heart disease   . Hyperlipemia   . Hypertension     Patient Active Problem List   Diagnosis Date Noted  . Benign prostatic hyperplasia with urinary obstruction 06/28/2015  . FOM (frequency of micturition) 04/27/2014  . 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 Adynamia 10/17/2013  . Personal history of healed traumatic fracture 10/17/2013  . Cerebral infarction (HCC) 05/26/2013  . Acid reflux 05/26/2013  . Clinical depression 11/03/2012  . Hypercholesterolemia 11/03/2012  . BP (high blood pressure) 11/03/2012  . Primary open angle glaucoma 05/27/2011  . Cataract, nuclear sclerotic senile 05/27/2011  . Glaucoma 05/27/2011  . History of colon polyps 08/29/2009  .  Abnormal colonoscopy 07/13/2006    Past Surgical History:  Procedure Laterality Date  . LEG SURGERY  1979    Prior to Admission medications   Medication Sig Start Date End Date Taking? Authorizing Provider  allopurinol (ZYLOPRIM) 300 MG tablet Take 300 mg by mouth daily.   Yes [provider]  aspirin 81 MG chewable tablet Chew by mouth.   Yes [provider]  dorzolamide-timolol (COSOPT) 22.3-6.8 MG/ML ophthalmic solution Place 1 drop into the left eye 2 (two) times daily.  08/09/15  Yes [provider]  doxazosin (CARDURA) 2 MG tablet Take 2 mg by mouth daily.   Yes [provider]  lisinopril (PRINIVIL,ZESTRIL) 10 MG tablet Take 10 mg by mouth daily.   Yes [provider]  sildenafil (VIAGRA) 100 MG tablet Take 0.5 tablets (50 mg total) by mouth daily as needed for erectile dysfunction (Take 1-5 tablets as needed 1 hour prior to sexual activity). Patient not taking: Reported on 03/12/2017 08/24/15   Vanna Scotland, MD    No Known Allergies  Family History  Problem Relation Age of Onset  . Prostate cancer Brother     Social History Social History   Tobacco Use  . Smoking status: Current Every Day Smoker    Packs/day: 1.00    Types: Cigarettes  . Smokeless tobacco: Never Used  Substance Use Topics  . Alcohol use: Yes    Alcohol/week: 0.0 oz  . Drug use: No    Review  of Systems  Constitutional: Negative for fever. Eyes: Negative for visual changes. ENT: Negative for sore throat. Cardiovascular: Negative for chest pain. Respiratory: Negative for shortness of breath. Gastrointestinal: Negative for abdominal pain, vomiting and diarrhea. Genitourinary: Negative for dysuria. Musculoskeletal: Negative for back pain. Skin: Negative for rash. Neurological: Negative for headache.  Positive for lightheadedness.  ____________________________________________   PHYSICAL EXAM:  VITAL SIGNS: ED Triage Vitals  Enc Vitals Group      BP --      Pulse Rate 03/12/17 0928 64     Resp 03/12/17 0928 20     Temp 03/12/17 0928 (!) 97.5 F (36.4 C)     Temp Source 03/12/17 0928 Oral     SpO2 03/12/17 0928 99 %     Weight 03/12/17 0929 170 lb (77.1 kg)     Height 03/12/17 0929 5\' 11"  (1.803 m)     Head Circumference --      Peak Flow --      Pain Score --      Pain Loc --      Pain Edu? --      Excl. in GC? --      Constitutional: Alert and oriented. Well appearing and in no distress. HEENT   Head: Normocephalic and atraumatic.      Eyes: Conjunctivae are normal. Pupils equal and round.  Somewhat poor vision, but reports no change from baseline, he is able to do finger to nose testing bilaterally.      Ears:         Nose: No congestion/rhinnorhea.   Mouth/Throat: Mucous membranes are moist.  No teeth.   Neck: No stridor. Cardiovascular/Chest: Normal rate, regular rhythm.  No murmurs, rubs, or gallops. Respiratory: Normal respiratory effort without tachypnea nor retractions. Breath sounds are clear and equal bilaterally. No wheezes/rales/rhonchi. Gastrointestinal: Soft. No distention, no guarding, no rebound. Nontender.    Genitourinary/rectal:Deferred Musculoskeletal: Nontender with normal range of motion in all extremities. No joint effusions.  No lower extremity tenderness.  No edema. Neurologic: No facial droop, cranial nerves II through X intact.  Normal tongue protrusion.  Normal speech and language.  Finger-nose intact bilaterally.  Heel to shin intact bilaterally.  5 out of 5 strength in 4 extremities.  No gross or focal neurologic deficits are appreciated. Skin:  Skin is warm, dry and intact. No rash noted. Psychiatric: Mood and affect are normal. Speech and behavior are normal. Patient exhibits appropriate insight and judgment.   ____________________________________________  LABS (pertinent positives/negatives) I, Governor Rooks, MD the attending physician have reviewed the labs noted below.  Labs  Reviewed  BASIC METABOLIC PANEL - Abnormal; Notable for the following components:      Result Value   Chloride 112 (*)    Glucose, Bld 119 (*)    BUN 23 (*)    Creatinine, Ser 1.86 (*)    GFR calc non Af Amer 35 (*)    GFR calc Af Amer 40 (*)    All other components within normal limits  CBC WITH DIFFERENTIAL/PLATELET - Abnormal; Notable for the following components:   RBC 4.31 (*)    HCT 37.8 (*)    All other components within normal limits  URINALYSIS, COMPLETE (UACMP) WITH MICROSCOPIC - Abnormal; Notable for the following components:   Color, Urine YELLOW (*)    APPearance CLEAR (*)    Hgb urine dipstick SMALL (*)    Protein, ur >=300 (*)    All other components within normal limits  GLUCOSE, CAPILLARY -  Abnormal; Notable for the following components:   Glucose-Capillary 114 (*)    All other components within normal limits  TROPONIN I    ____________________________________________    EKG I, Governor Rooksebecca Dionta Larke, MD, the attending physician have personally viewed and interpreted all ECGs.  61 bpm.  Narrow QRS.  Left axis deviation.  Likely LVH.  ST segment depression inferiorly with T wave inversions inferiorly and laterally, leads I and aVL.  New from prior EKG from 2015. ____________________________________________  RADIOLOGY All Xrays were viewed by me.  Imaging interpreted by Radiologist, and I, Governor Rooksebecca Cesar Rogerson, MD the attending physician have reviewed the radiologist interpretation noted below.  CT head without contrast:  IMPRESSION: 1. No acute intracranial pathology. 2. Chronic microvascular disease and cerebral atrophy.  MRI brain without contrast:  IMPRESSION: No acute finding by MRI. Old lacunar infarctions and chronic small-vessel ischemic changes affecting the pons, basal ganglia and hemispheric white matter. __________________________________________  PROCEDURES  Procedure(s) performed: None  Critical Care performed:  None   ____________________________________________  ED COURSE / ASSESSMENT AND PLAN  Pertinent labs & imaging results that were available during my care of the patient were reviewed by me and considered in my medical decision making (see chart for details).   Patient arrives with the complaint of dizziness which she further describes as lightheadedness.  He is not reporting focal neurologic deficit.  His wife states that she felt like he was drooling a little bit of the right side of his mouth this morning, but he is not doing that now and he does not have any facial drooping.  He does not really have a focal neurologic findings to make me highly concerned about new acute stroke, and in any case time of onset would be unknown, last known normal would have been last night before he went to bed.  In any case, with a history of stroke and patient states he had dizziness with the past stroke, I will go ahead and obtain head CT.  Of note, wife also notices that the patient actually put on her jeans this morning instead of his own.  His EKG is somewhat abnormal with comparison to prior which were from 2015, he is not reporting any chest pain or palpitations or trouble breathing.  He states he has a little bit of nausea.  Wife states that the bed was wet with sweat.  We will be checking troponin as well.   Head CT is reassuring.  We discussed obtaining MRI and this was reassuring for no acute stroke findings.  Patient's patient's symptoms of dizziness, I do not have a high suspicion of TIA.  Uncertain etiology, however his exam and evaluation are overall reassuring in the emergency room today.  Discussed valuation with patient and his son and his wife, and they are comfortable with discharge home and outpatient follow-up.  He has had elevated blood pressure while here, he did miss his morning dose and I was given him his morning doses based on the notes in the computer system, patient is not  quite sure what his exact medications are.  In any case, he is hypertensive here, but is not having any ongoing symptoms now things having hypertensive emergency.  I am going to ask him to go ahead and take his medications when he gets home.   Addended to include, EKG does look different from prior several years ago, however the patient is not having any clear cardiac symptoms and troponin is negative.  I do not think  the dizziness he had earlier when he just got out of bed is related to cardiac.  I am asking him to follow-up with primary care doctor and likely cardiology as well given change in EKG from prior several years ago.  We discussed return precautions with regard to cardiac symptoms.  DIFFERENTIAL DIAGNOSIS: Including but not limited to anemia, dehydration, renal failure, electrolyte disturbance, urinary tract infection, stroke, etc.  CONSULTATIONS: None   Patient / Family / Caregiver informed of clinical course, medical decision-making process, and agree with plan.   I discussed return precautions, follow-up instructions, and discharge instructions with patient and/or family.  Discharge Instructions : You are evaluated for dizziness, and although no certain cause was found, your exam and evaluation are overall reassuring in the emergency room today.  Return to the emergency room immediately for any worsening condition including confusion or altered mental status, chest pain, dizziness or passing out, weakness, numbness, fever, or any other symptoms concerning to you.    ___________________________________________   FINAL CLINICAL IMPRESSION(S) / ED DIAGNOSES   Final diagnoses:  Dizziness      ___________________________________________        Note: This dictation was prepared with Dragon dictation. Any transcriptional errors that result from this process are unintentional    Governor RooksLord, Mohamadou Maciver, MD 03/12/17 1517    Governor RooksLord, Joelle Flessner, MD 03/12/17 [phone]

## 2017-03-12 NOTE — ED Notes (Signed)

## 2017-03-12 NOTE — ED Triage Notes (Signed)
Pt started with feeling lightheaded 30 minutes ago.  Wife noted some drooling she thinks from right side of mouth. Generalized weakness.  No facial droop, grip strength equal. Wife reports difficulty urinating last night but all other symptoms started today after waking. Unable to state month. Said 72 and pt is 71. Hx multiple stroke

## 2017-03-12 NOTE — Discharge Instructions (Signed)
You are evaluated for dizziness, and although no certain cause was found, your exam and evaluation are overall reassuring in the emergency room today.  Return to the emergency room immediately for any worsening condition including confusion or altered mental status, chest pain, dizziness or passing out, weakness, numbness, fever, or any other symptoms concerning to you.

## 2017-07-24 DIAGNOSIS — I6789 Other cerebrovascular disease: Secondary | ICD-10-CM

## 2017-07-24 DIAGNOSIS — Z720 Tobacco use: Secondary | ICD-10-CM

## 2017-07-24 DIAGNOSIS — I10 Essential (primary) hypertension: Secondary | ICD-10-CM

## 2017-07-24 DIAGNOSIS — I639 Cerebral infarction, unspecified: Secondary | ICD-10-CM | POA: Diagnosis not present

## 2017-07-24 DIAGNOSIS — N289 Disorder of kidney and ureter, unspecified: Secondary | ICD-10-CM

## 2017-07-24 DIAGNOSIS — E785 Hyperlipidemia, unspecified: Secondary | ICD-10-CM

## 2017-07-25 DIAGNOSIS — I10 Essential (primary) hypertension: Secondary | ICD-10-CM | POA: Diagnosis not present

## 2017-07-25 DIAGNOSIS — N289 Disorder of kidney and ureter, unspecified: Secondary | ICD-10-CM | POA: Diagnosis not present

## 2017-07-25 DIAGNOSIS — I639 Cerebral infarction, unspecified: Secondary | ICD-10-CM | POA: Diagnosis not present

## 2017-07-25 DIAGNOSIS — Z720 Tobacco use: Secondary | ICD-10-CM | POA: Diagnosis not present

## 2017-07-25 DIAGNOSIS — E785 Hyperlipidemia, unspecified: Secondary | ICD-10-CM | POA: Diagnosis not present

## 2017-09-21 ENCOUNTER — Encounter: Payer: Self-pay | Admitting: Neurology

## 2017-09-21 ENCOUNTER — Ambulatory Visit (INDEPENDENT_AMBULATORY_CARE_PROVIDER_SITE_OTHER): Payer: Medicare Other | Admitting: Neurology

## 2017-09-21 VITALS — BP 152/67 | HR 78 | Ht 71.0 in | Wt 167.8 lb

## 2017-09-21 DIAGNOSIS — I639 Cerebral infarction, unspecified: Secondary | ICD-10-CM | POA: Diagnosis not present

## 2017-09-21 MED ORDER — CLOPIDOGREL BISULFATE 75 MG PO TABS: 75.0000 mg | ORAL_TABLET | Freq: Every day | ORAL | 11 refills | Status: DC

## 2017-09-21 NOTE — Progress Notes (Addendum)
PATIENT: Maurice Sullivan DOB: 13-Mar-1946  Chief Complaint  Patient presents with  . Cerebrovascular Accident    He is here with his wife, Maurice Sullivan.  Reports having his third stroke in May 2019.  He was treated at [facility].  He completed PT during his admission.  He still has some residual weakness that causes him gait difficulty.  He is only taking aspirin 81mg  daily at this time.  His wife says he was on Plavix 75mg  daily after his discharge but it was a temporary prescription until he followed up with neurology.  Marland Kitchen PCP    Maurice School, PA-C (referred from hospital)     HISTORICAL  Maurice Sullivan is a 72 years old male, seen in refer by his primary care PA land, Aneta Mins, for evaluation of cerebrovascular accident, initial evaluation was on September 21, 2017.  He is accompanied by his wife central at today's visit.  He had a past medical history of hypertension, hyperlipidemia, had multiple stroke in the past, most recent one was in May 2019, was treated at [facility], previous stroke was treated in [place] in 2013,  In May 2019, while he was at his cousin's funeral, he suddenly felt dizziness, mild facial asymmetry, body shaking, sweaty, unsteady gait, symptoms lasted about 30 days and then gradually improved, he was taken by ambulance to [facility], and he had a stroke, but he was not sure whether he had a echocardiogram ultrasound of carotid artery or not, he was taking aspirin discharge home was Plavix, but he has run out of his Plavix now.  I was able to personally review MRI of the brain in 2018, in May 2019, 2 small subcentimeter acute to early subacute small vessel infarction involving the left periventricular white matter frontal coronary radiata, stable chronic supratentorium small vessel disease. He now continue complains of left shoulder pain, left arm weakness, mildly unsteady gait.  REVIEW OF SYSTEMS: Full 14 system review of systems performed  and notable only for pain hearing loss, ringing the ears, rash, itching, impotence, blurred vision, double vision, cough, incontinence, allergy, runny nose  ALLERGIES: No Known Allergies  HOME MEDICATIONS: Current Outpatient Medications  Medication Sig Dispense Refill  . allopurinol (ZYLOPRIM) 300 MG tablet Take 300 mg by mouth daily.    Marland Kitchen amLODipine (NORVASC) 10 MG tablet Take 10 mg by mouth daily.  0  . aspirin 81 MG chewable tablet Chew by mouth.    Marland Kitchen atorvastatin (LIPITOR) 40 MG tablet Take 40 mg by mouth daily.  0  . dorzolamide-timolol (COSOPT) 22.3-6.8 MG/ML ophthalmic solution Place 1 drop into the left eye 2 (two) times daily.     Marland Kitchen doxazosin (CARDURA) 2 MG tablet Take 2 mg by mouth daily.    Marland Kitchen lisinopril (PRINIVIL,ZESTRIL) 10 MG tablet Take 10 mg by mouth daily.    . sildenafil (VIAGRA) 100 MG tablet Take 0.5 tablets (50 mg total) by mouth daily as needed for erectile dysfunction (Take 1-5 tablets as needed 1 hour prior to sexual activity). 30 tablet 5   No current facility-administered medications for this visit.     PAST MEDICAL HISTORY: Past Medical History:  Diagnosis Date  . Anxiety   . Arthritis   . CVA (cerebral infarction)   . Depression   . Glaucoma   . Gout   . Heart disease   . Hyperlipemia   . Hypertension     PAST SURGICAL HISTORY: Past Surgical History:  Procedure Laterality Date  .  LEG SURGERY  1979    FAMILY HISTORY: Family History  Problem Relation Age of Onset  . Prostate cancer Brother   . Stroke Mother   . Colon cancer Mother   . Stroke Father     SOCIAL HISTORY:  Social History   Socioeconomic History  . Marital status: Married    Spouse name: Not on file  . Number of children: 3  . Years of education: 11th grade  . Highest education level: Not on file  Occupational History  . Occupation: Retired Network engineertruck driver  Social Needs  . Financial resource strain: Not on file  . Food insecurity:    Worry: Not on file    Inability:  Not on file  . Transportation needs:    Medical: Not on file    Non-medical: Not on file  Tobacco Use  . Smoking status: Current Every Day Smoker    Packs/day: 0.50    Types: Cigarettes  . Smokeless tobacco: Never Used  Substance and Sexual Activity  . Alcohol use: Yes    Alcohol/week: 0.0 oz    Comment: holidays  . Drug use: No  . Sexual activity: Not on file  Lifestyle  . Physical activity:    Days per week: Not on file    Minutes per session: Not on file  . Stress: Not on file  Relationships  . Social connections:    Talks on phone: Not on file    Gets together: Not on file    Attends religious service: Not on file    Active member of club or organization: Not on file    Attends meetings of clubs or organizations: Not on file    Relationship status: Not on file  . Intimate partner violence:    Fear of current or ex partner: Not on file    Emotionally abused: Not on file    Physically abused: Not on file    Forced sexual activity: Not on file  Other Topics Concern  . Not on file  Social History Narrative   Lives at home with his wife and son.   Right-handed.   2 cups caffeine daily.     PHYSICAL EXAM   Vitals:   09/21/17 0728  Weight: 167 lb 12 oz (76.1 kg)  Height: 5\' 11"  (1.803 m)    Not recorded      Body mass index is 23.4 kg/m.  PHYSICAL EXAMNIATION:  Gen: NAD, conversant, well nourised, obese, well groomed                     Cardiovascular: Regular rate rhythm, no peripheral edema, warm, nontender. Eyes: Conjunctivae clear without exudates or hemorrhage Neck: Supple, no carotid bruits. Pulmonary: Clear to auscultation bilaterally   NEUROLOGICAL EXAM:  MENTAL STATUS: Speech:    Speech is normal; fluent and spontaneous with normal comprehension.  Cognition:     Orientation to time, place and person     Normal recent and remote memory     Normal Attention span and concentration     Normal Language, naming, repeating,spontaneous speech      Fund of knowledge   CRANIAL NERVES: CN II: Visual fields are full to confrontation. Fundoscopic exam is normal with sharp discs and no vascular changes. Pupils are round equal and briskly reactive to light. CN III, IV, VI: extraocular movement are normal. No ptosis. CN V: Facial sensation is intact to pinprick in all 3 divisions bilaterally. Corneal responses are intact.  CN VII:  Face is symmetric with normal eye closure and smile. CN VIII: Hearing is normal to rubbing fingers CN IX, X: Palate elevates symmetrically. Phonation is normal. CN XI: Head turning and shoulder shrug are intact CN XII: Tongue is midline with normal movements and no atrophy.  MOTOR: Limited range of motion of left shoulder, left upper extremity proximal muscle strength testing is limited by his left shoulder pain.  REFLEXES: Reflexes are 2+ and symmetric at the biceps, triceps, knees, and ankles. Plantar responses are flexor.  SENSORY: Intact to light touch, pinprick, positional sensation and vibratory sensation are intact in fingers and toes.  COORDINATION: Rapid alternating movements and fine finger movements are intact. There is no dysmetria on finger-to-nose and heel-knee-shin.    GAIT/STANCE: He needs pushed up to get up from seated position, cautious, mildly unsteady  DIAGNOSTIC DATA (LABS, IMAGING, TESTING) - I reviewed patient records, labs, notes, testing and imaging myself where available.   ASSESSMENT AND PLAN  Maurice Sullivan is a 72 y.o. male   Stroke  Small vessel disease, vascular risk factor of hypertension, hyperlipidemia  Start Plavix 75 mg daily  Complete evaluation with echocardiogram, ultrasound of carotid artery  Keep well hydration  Continue moderate exercise  Return to clinic in 3 months with nurse practitioner  Get medical record from [facility]   Levert Feinstein, M.D. Ph.D.  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, still smoking, noncompliance with his medications, CT head no acute lesions,  Laboratory evaluations LDL was 77, INR was 1.1, hemoglobin of 11.9, creatinine of 1.5,  MRI showed 2 small subcentimeter acute to early small vessel type infarction involving the periventricular white matter of the left frontal corona radiata, stable atrophy with chronic small vessel ischemic disease with remote lacunar infarction involving bilateral basal ganglion, pons, hemispheric white matter,  CT angiogram of head and neck no large vessel occlusion,  Echocardiogram, ejection fraction 55 to 60%, mild to moderate aortic regurgitation,

## 2017-10-01 ENCOUNTER — Ambulatory Visit (HOSPITAL_COMMUNITY): Admission: RE | Admit: 2017-10-01 | Payer: Medicare Other | Source: Ambulatory Visit

## 2017-10-07 ENCOUNTER — Ambulatory Visit (HOSPITAL_COMMUNITY)
Admission: RE | Admit: 2017-10-07 | Discharge: 2017-10-07 | Disposition: A | Discharge status: Home / Self Care | Payer: Medicare Other | Source: Ambulatory Visit | Attending: Neurology | Admitting: Neurology

## 2017-10-07 DIAGNOSIS — I639 Cerebral infarction, unspecified: Secondary | ICD-10-CM | POA: Diagnosis present

## 2017-10-07 DIAGNOSIS — I080 Rheumatic disorders of both mitral and aortic valves: Secondary | ICD-10-CM | POA: Diagnosis not present

## 2017-10-07 NOTE — Progress Notes (Signed)
  Echocardiogram 2D Echocardiogram has been performed.  [name], Maurice Sullivan 10/07/2017, 2:56 PM

## 2017-10-12 ENCOUNTER — Ambulatory Visit (HOSPITAL_COMMUNITY)
Admission: RE | Admit: 2017-10-12 | Discharge: 2017-10-12 | Disposition: A | Discharge status: Home / Self Care | Payer: Medicare Other | Source: Ambulatory Visit | Attending: Neurology | Admitting: Neurology

## 2017-10-12 DIAGNOSIS — I6523 Occlusion and stenosis of bilateral carotid arteries: Secondary | ICD-10-CM | POA: Diagnosis not present

## 2017-10-12 DIAGNOSIS — I639 Cerebral infarction, unspecified: Secondary | ICD-10-CM

## 2017-10-12 NOTE — Progress Notes (Signed)
Carotid duplex prelim:  Right Carotid: Velocities in the right ICA are consistent with a 1-39% stenosis. However, based on plaque formation and elevated systolic velocity, stenosis is most likely underestimated.    Left Carotid:Velocities in the left ICA are consistent with a 1-39% stenosis.   Farrel DemarkJill Eunice, RDMS, RVT

## 2017-10-14 ENCOUNTER — Telehealth: Payer: Self-pay | Admitting: Neurology

## 2017-10-14 NOTE — Telephone Encounter (Signed)
Please call patient the report of US carotid arteries. keep plavix daily  Final Interpretation: Right Carotid: Velocities in the right ICA are consistent with a 40-59%        stenosis. However, based on moderate plaque formation and        elevated systolic velocity, stenosis is most likely        underestimated.  Left Carotid: Velocities in the left ICA are consistent with a 1-39% stenosis.  Vertebrals: Bilateral vertebral arteries demonstrate antegrade flow. Subclavians: Elevated flow velocities in both subclavians suggest mild stenosis

## 2017-10-14 NOTE — Telephone Encounter (Signed)
Left patient a detailed message, with results, on voicemail at both home and cell numbers (ok per DPR).  Instructed him to continue daily Plavix 75mg  and to keep his follow up appts. Provided our number to call back with any questions.

## 2018-01-07 NOTE — Progress Notes (Signed)
[facility]  PATIENT: Maurice Sullivan DOB: 02/16/1946   REASON FOR VISIT: Follow-up for CVA HISTORY FROM: Patient and son Maurice Sullivan    HISTORY OF PRESENT ILLNESS: 7/1/19YYAdolphus Saburo Sullivan is a 72 years old male, seen in refer by his primary care PA land, Aneta Mins, for evaluation of cerebrovascular accident, initial evaluation was on September 21, 2017.  He is accompanied by his wife central at today's visit.  He had a past medical history of hypertension, hyperlipidemia, had multiple stroke in the past, most recent one was in May 2019, was treated at [facility], previous stroke was treated in [place] in 2013,  In May 2019, while he was at his cousin's funeral, he suddenly felt dizziness, mild facial asymmetry, body shaking, sweaty, unsteady gait, symptoms lasted about 30 days and then gradually improved, he was taken by ambulance to [facility], and he had a stroke, but he was not sure whether he had a echocardiogram ultrasound of carotid artery or not, he was taking aspirin discharge home was Plavix, but he has run out of his Plavix now.  I was able to personally review MRI of the brain in 2018, in May 2019, 2 small subcentimeter acute to early subacute small vessel infarction involving the left periventricular white matter frontal coronary radiata, stable chronic supratentorium small vessel disease. He now continue complains of left shoulder pain, left arm weakness, mildly unsteady gait. UPDATE 10/21/2019CM [name], 72 year old male returns for follow-up with history of stroke in May 2019.  He was seen at [facility].  He is currently on Plavix daily without further stroke or TIA symptoms and minimal bruising no bleeding.  In addition he is on Lipitor without myalgias.  Blood pressure in the office today 146/78.  He remains on 3 blood pressure medications.  He continues to have problems with his left shoulder, he is seen by orthopedist at [facility].  He claims he has had a couple of injections.  2D echo with EF 50 to 55%.  Doppler studies done right carotid 40 to 59% stenosis left 1 to 39% stenosis in July 2019.  He continues to smoke but is going to try to quit with Chantix.  He does walk daily for exercise.  Appetite is good and he is sleeping well.  He returns for reevaluation REVIEW OF SYSTEMS: Full 14 system review of systems performed and notable only for those listed, all others are neg:  Constitutional: Fatigue Cardiovascular: neg Ear/Nose/Throat: neg  Skin: neg Eyes: Blurred vision Respiratory: neg Gastroitestinal: neg  Hematology/Lymphatic: neg  Endocrine: neg Musculoskeletal:neg Allergy/Immunology: neg Neurological: neg Psychiatric: Anxiety Sleep : neg   ALLERGIES: No Known Allergies  HOME MEDICATIONS: Outpatient Medications Prior to Visit  Medication Sig Dispense Refill  . allopurinol (ZYLOPRIM) 300 MG tablet Take 300 mg by mouth daily.    Marland Kitchen amLODipine (NORVASC) 10 MG tablet Take 10 mg by mouth daily.  0  . atorvastatin (LIPITOR) 40 MG tablet Take 40 mg by mouth daily.  0  . clopidogrel (PLAVIX) 75 MG tablet Take 1 tablet (75 mg total) by mouth daily. 30 tablet 11  . dorzolamide-timolol (COSOPT) 22.3-6.8 MG/ML ophthalmic solution Place 1 drop into the left eye 2 (two) times daily.     Marland Kitchen doxazosin (CARDURA) 2 MG tablet Take 2 mg by mouth daily.    Marland Kitchen lisinopril (PRINIVIL,ZESTRIL) 10 MG tablet Take 10 mg by mouth daily.    . sildenafil (VIAGRA) 100 MG tablet Take 0.5 tablets (50 mg  total) by mouth daily as needed for erectile dysfunction (Take 1-5 tablets as needed 1 hour prior to sexual activity). 30 tablet 5   No facility-administered medications prior to visit.     PAST MEDICAL HISTORY: Past Medical History:  Diagnosis Date  . Anxiety   . Arthritis   . CVA (cerebral infarction)   . Depression   . Glaucoma   . Gout   . Heart disease   . Hyperlipemia   . Hypertension     PAST SURGICAL  HISTORY: Past Surgical History:  Procedure Laterality Date  . LEG SURGERY  1979    FAMILY HISTORY: Family History  Problem Relation Age of Onset  . Prostate cancer Brother   . Stroke Mother   . Colon cancer Mother   . Stroke Father     SOCIAL HISTORY: Social History   Socioeconomic History  . Marital status: Married    Spouse name: Not on file  . Number of children: 3  . Years of education: 11th grade  . Highest education level: Not on file  Occupational History  . Occupation: Retired Network engineer  . Financial resource strain: Not on file  . Food insecurity:    Worry: Not on file    Inability: Not on file  . Transportation needs:    Medical: Not on file    Non-medical: Not on file  Tobacco Use  . Smoking status: Current Every Day Smoker    Packs/day: 0.50    Types: Cigarettes  . Smokeless tobacco: Never Used  Substance and Sexual Activity  . Alcohol use: Yes    Alcohol/week: 0.0 standard drinks    Comment: holidays  . Drug use: No  . Sexual activity: Not on file  Lifestyle  . Physical activity:    Days per week: Not on file    Minutes per session: Not on file  . Stress: Not on file  Relationships  . Social connections:    Talks on phone: Not on file    Gets together: Not on file    Attends religious service: Not on file    Active member of club or organization: Not on file    Attends meetings of clubs or organizations: Not on file    Relationship status: Not on file  . Intimate partner violence:    Fear of current or ex partner: Not on file    Emotionally abused: Not on file    Physically abused: Not on file    Forced sexual activity: Not on file  Other Topics Concern  . Not on file  Social History Narrative   Lives at home with his wife and son.   Right-handed.   2 cups caffeine daily.     PHYSICAL EXAM  Vitals:   01/11/18 0827  BP: 146/78  Pulse: 86  Weight: 171 lb 12.8 oz (77.9 kg)  Height: 5\' 11"  (1.803 m)   Body mass  index is 23.96 kg/m.  Generalized: Well developed, in no acute distress  Head: normocephalic and atraumatic,. Oropharynx benign  Neck: Supple, no carotid bruits  Cardiac: Regular rate rhythm, no murmur  Musculoskeletal: No deformity   Neurological examination   Mentation: Alert oriented to time, place, history taking. Attention span and concentration appropriate. Recent and remote memory intact.  Follows all commands speech and language fluent.   Cranial nerve II-XII: Pupils were equal round reactive to light extraocular movements were full, visual field were full on confrontational test. Facial sensation and strength  were normal. hearing was intact to finger rubbing bilaterally. Uvula tongue midline. head turning and shoulder shrug were normal and symmetric.Tongue protrusion into cheek strength was normal. Motor: normal bulk and tone, full strength in the BUE, BLE, left shoulder limited by pain  Sensory: normal and symmetric to light touch,  Coordination: finger-nose-finger, heel-to-shin bilaterally, no dysmetria Reflexes: Brachioradialis 2/2, biceps 2/2, triceps 2/2, patellar 2/2, Achilles 2/2, plantar responses were flexor bilaterally. Gait and Station: Rising up from seated position with push cautious mildly unsteady gait   DIAGNOSTIC DATA (LABS, IMAGING, TESTING) - I reviewed patient records, labs, notes, testing and imaging myself where available.  Lab Results  Component Value Date   WBC 7.8 03/12/2017   HGB 13.0 03/12/2017   HCT 37.8 (L) 03/12/2017   MCV 87.6 03/12/2017   PLT 289 03/12/2017      Component Value Date/Time   NA 145 03/12/2017 0945   K 4.3 03/12/2017 0945   CL 112 (H) 03/12/2017 0945   CO2 28 03/12/2017 0945   GLUCOSE 119 (H) 03/12/2017 0945   BUN 23 (H) 03/12/2017 0945   CREATININE 1.86 (H) 03/12/2017 0945   CALCIUM 9.1 03/12/2017 0945   GFRNONAA 35 (L) 03/12/2017 0945   GFRAA 40 (L) 03/12/2017 0945    ASSESSMENT AND PLAN  72 y.o. year old male   has a past medical history of Anxiety, Arthritis, CVA (cerebral infarction), Depression, Glaucoma, Gout, Heart disease, Hyperlipemia, and Hypertension. here to follow-up for stroke event in May 2019.  Patient has vascular risk factors of hypertension hyperlipidemia.  PLAN: Stressed the importance of management of risk factors to prevent further stroke Continue Plavix for secondary stroke prevention Maintain strict control of hypertension with blood pressure goal below 130/90, today's reading 146/78 continue antihypertensive medications Control of diabetes with hemoglobin A1c below 6.5 followed by primary care  Cholesterol with LDL cholesterol less than 70, followed by primary care,   continue statin drug Lipitor Exercise by walking, , eat healthy diet with whole grains,  fresh fruits and vegetables Follow-up with primary care for stroke risk factor modification, maintain blood pressure goal less than 130 systolic, diabetes with A1c below 7, lipids with LDL below 70 Stop smoking F/U in 6 months This was a visit requiring 25 minutes and medical decision making of high complexity with extensive review of history, hospital chart from [facility], counseling and answering questions Nilda Riggs, [facility], [facility], APRN  [facility] [address] [place], [place] [address] [phone]

## 2018-01-11 ENCOUNTER — Encounter: Payer: Self-pay | Admitting: Nurse Practitioner

## 2018-01-11 ENCOUNTER — Ambulatory Visit (INDEPENDENT_AMBULATORY_CARE_PROVIDER_SITE_OTHER): Payer: Medicare Other | Admitting: Nurse Practitioner

## 2018-01-11 VITALS — BP 146/78 | HR 86 | Ht 71.0 in | Wt 171.8 lb

## 2018-01-11 DIAGNOSIS — I10 Essential (primary) hypertension: Secondary | ICD-10-CM | POA: Diagnosis not present

## 2018-01-11 DIAGNOSIS — I639 Cerebral infarction, unspecified: Secondary | ICD-10-CM

## 2018-01-11 DIAGNOSIS — E7800 Pure hypercholesterolemia, unspecified: Secondary | ICD-10-CM

## 2018-01-11 NOTE — Patient Instructions (Signed)
Stressed the importance of management of risk factors to prevent further stroke Continue Plavix for secondary stroke prevention Maintain strict control of hypertension with blood pressure goal below 130/90, today's reading  continue antihypertensive medications Control of diabetes with hemoglobin A1c below 6.5 followed by primary care  Cholesterol with LDL cholesterol less than 70, followed by primary care,   continue statin drug Lipitor Exercise by walking, , eat healthy diet with whole grains,  fresh fruits and vegetables Follow-up with primary care for stroke risk factor modification, maintain blood pressure goal less than 130 systolic, diabetes with A1c below 7, lipids with LDL below 70 Stop smoking F/U in 6 months

## 2018-01-12 NOTE — Progress Notes (Signed)
I have reviewed and agreed above plan. 

## 2018-02-14 ENCOUNTER — Inpatient Hospital Stay
Admission: EM | Admit: 2018-02-14 | Discharge: 2018-02-16 | DRG: 304 | Disposition: A | Discharge status: Home Health | Payer: Medicare Other | Attending: Internal Medicine | Admitting: Internal Medicine

## 2018-02-14 ENCOUNTER — Emergency Department: Payer: Medicare Other

## 2018-02-14 ENCOUNTER — Encounter: Payer: Self-pay | Admitting: Emergency Medicine

## 2018-02-14 ENCOUNTER — Other Ambulatory Visit: Payer: Self-pay

## 2018-02-14 DIAGNOSIS — Z79899 Other long term (current) drug therapy: Secondary | ICD-10-CM | POA: Diagnosis not present

## 2018-02-14 DIAGNOSIS — N183 Chronic kidney disease, stage 3 (moderate): Secondary | ICD-10-CM | POA: Diagnosis present

## 2018-02-14 DIAGNOSIS — I5031 Acute diastolic (congestive) heart failure: Secondary | ICD-10-CM | POA: Diagnosis present

## 2018-02-14 DIAGNOSIS — H409 Unspecified glaucoma: Secondary | ICD-10-CM | POA: Diagnosis present

## 2018-02-14 DIAGNOSIS — I10 Essential (primary) hypertension: Secondary | ICD-10-CM | POA: Diagnosis present

## 2018-02-14 DIAGNOSIS — Z7902 Long term (current) use of antithrombotics/antiplatelets: Secondary | ICD-10-CM

## 2018-02-14 DIAGNOSIS — Z8673 Personal history of transient ischemic attack (TIA), and cerebral infarction without residual deficits: Secondary | ICD-10-CM | POA: Diagnosis not present

## 2018-02-14 DIAGNOSIS — Z23 Encounter for immunization: Secondary | ICD-10-CM

## 2018-02-14 DIAGNOSIS — R0602 Shortness of breath: Secondary | ICD-10-CM | POA: Diagnosis present

## 2018-02-14 DIAGNOSIS — E876 Hypokalemia: Secondary | ICD-10-CM | POA: Diagnosis not present

## 2018-02-14 DIAGNOSIS — D649 Anemia, unspecified: Secondary | ICD-10-CM | POA: Diagnosis present

## 2018-02-14 DIAGNOSIS — I161 Hypertensive emergency: Secondary | ICD-10-CM | POA: Diagnosis present

## 2018-02-14 DIAGNOSIS — I351 Nonrheumatic aortic (valve) insufficiency: Secondary | ICD-10-CM | POA: Diagnosis present

## 2018-02-14 DIAGNOSIS — I160 Hypertensive urgency: Secondary | ICD-10-CM

## 2018-02-14 DIAGNOSIS — F17210 Nicotine dependence, cigarettes, uncomplicated: Secondary | ICD-10-CM | POA: Diagnosis present

## 2018-02-14 DIAGNOSIS — I248 Other forms of acute ischemic heart disease: Secondary | ICD-10-CM | POA: Diagnosis present

## 2018-02-14 DIAGNOSIS — I130 Hypertensive heart and chronic kidney disease with heart failure and stage 1 through stage 4 chronic kidney disease, or unspecified chronic kidney disease: Secondary | ICD-10-CM | POA: Diagnosis present

## 2018-02-14 DIAGNOSIS — R7989 Other specified abnormal findings of blood chemistry: Secondary | ICD-10-CM

## 2018-02-14 DIAGNOSIS — E785 Hyperlipidemia, unspecified: Secondary | ICD-10-CM | POA: Diagnosis present

## 2018-02-14 DIAGNOSIS — R778 Other specified abnormalities of plasma proteins: Secondary | ICD-10-CM

## 2018-02-14 DIAGNOSIS — J90 Pleural effusion, not elsewhere classified: Secondary | ICD-10-CM

## 2018-02-14 LAB — POCT I-STAT, CHEM 8
BUN: 18 mg/dL (ref 8–23)
CALCIUM ION: 1.17 mmol/L (ref 1.15–1.40)
Chloride: 117 mmol/L — ABNORMAL HIGH (ref 98–111)
Creatinine, Ser: 2 mg/dL — ABNORMAL HIGH (ref 0.61–1.24)
Glucose, Bld: 100 mg/dL — ABNORMAL HIGH (ref 70–99)
HEMATOCRIT: 27 % — AB (ref 39.0–52.0)
HEMOGLOBIN: 9.2 g/dL — AB (ref 13.0–17.0)
Potassium: 3.9 mmol/L (ref 3.5–5.1)
SODIUM: 142 mmol/L (ref 135–145)
TCO2: 21 mmol/L — AB (ref 22–32)

## 2018-02-14 LAB — CBC WITH DIFFERENTIAL/PLATELET
ABS IMMATURE GRANULOCYTES: 0.05 10*3/uL (ref 0.00–0.07)
Basophils Absolute: 0 10*3/uL (ref 0.0–0.1)
Basophils Relative: 0 %
EOS PCT: 1 %
Eosinophils Absolute: 0.1 10*3/uL (ref 0.0–0.5)
HCT: 34.4 % — ABNORMAL LOW (ref 39.0–52.0)
HEMOGLOBIN: 11.3 g/dL — AB (ref 13.0–17.0)
Immature Granulocytes: 1 %
LYMPHS PCT: 21 %
Lymphs Abs: 2 10*3/uL (ref 0.7–4.0)
MCH: 30 pg (ref 26.0–34.0)
MCHC: 32.8 g/dL (ref 30.0–36.0)
MCV: 91.2 fL (ref 80.0–100.0)
MONO ABS: 0.6 10*3/uL (ref 0.1–1.0)
MONOS PCT: 6 %
NEUTROS PCT: 71 %
NRBC: 0 % (ref 0.0–0.2)
Neutro Abs: 6.8 10*3/uL (ref 1.7–7.7)
Platelets: 316 10*3/uL (ref 150–400)
RBC: 3.77 MIL/uL — AB (ref 4.22–5.81)
RDW: 16.2 % — ABNORMAL HIGH (ref 11.5–15.5)
WBC: 9.6 10*3/uL (ref 4.0–10.5)

## 2018-02-14 LAB — COMPREHENSIVE METABOLIC PANEL
ALK PHOS: 98 U/L (ref 38–126)
ALT: 12 U/L (ref 0–44)
ANION GAP: 9 (ref 5–15)
AST: 20 U/L (ref 15–41)
Albumin: 2.5 g/dL — ABNORMAL LOW (ref 3.5–5.0)
BILIRUBIN TOTAL: 0.5 mg/dL (ref 0.3–1.2)
BUN: 21 mg/dL (ref 8–23)
CALCIUM: 8.1 mg/dL — AB (ref 8.9–10.3)
CO2: 19 mmol/L — AB (ref 22–32)
CREATININE: 1.79 mg/dL — AB (ref 0.61–1.24)
Chloride: 114 mmol/L — ABNORMAL HIGH (ref 98–111)
GFR calc non Af Amer: 36 mL/min — ABNORMAL LOW (ref >60)
GFR, EST AFRICAN AMERICAN: 42 mL/min — AB (ref >60)
Glucose, Bld: 105 mg/dL — ABNORMAL HIGH (ref 70–99)
Potassium: 3.7 mmol/L (ref 3.5–5.1)
SODIUM: 142 mmol/L (ref 135–145)
TOTAL PROTEIN: 6.2 g/dL — AB (ref 6.5–8.1)

## 2018-02-14 LAB — TROPONIN I: TROPONIN I: 0.08 ng/mL — AB (ref <0.03)

## 2018-02-14 LAB — BRAIN NATRIURETIC PEPTIDE: B Natriuretic Peptide: 791 pg/mL — ABNORMAL HIGH (ref 0.0–100.0)

## 2018-02-14 MED ORDER — LABETALOL HCL 5 MG/ML IV SOLN
5.0000 mg | Freq: Once | INTRAVENOUS | Status: AC
  Administered 2018-02-14: 5 mg via INTRAVENOUS
  Filled 2018-02-14: qty 4

## 2018-02-14 MED ORDER — CLOPIDOGREL BISULFATE 75 MG PO TABS
75.0000 mg | ORAL_TABLET | Freq: Every day | ORAL | Status: DC
  Administered 2018-02-15 – 2018-02-16 (×2): 75 mg via ORAL
  Filled 2018-02-14 (×2): qty 1

## 2018-02-14 MED ORDER — GUAIFENESIN-DM 100-10 MG/5ML PO SYRP: 5.0000 mL | ORAL_SOLUTION | ORAL | Status: DC | PRN

## 2018-02-14 MED ORDER — ONDANSETRON HCL 4 MG PO TABS: 4.0000 mg | ORAL_TABLET | Freq: Four times a day (QID) | ORAL | Status: DC | PRN

## 2018-02-14 MED ORDER — SODIUM CHLORIDE 0.9% FLUSH
3.0000 mL | Freq: Two times a day (BID) | INTRAVENOUS | Status: DC
  Administered 2018-02-14 – 2018-02-16 (×4): 3 mL via INTRAVENOUS

## 2018-02-14 MED ORDER — BISACODYL 5 MG PO TBEC: 5.0000 mg | DELAYED_RELEASE_TABLET | Freq: Every day | ORAL | Status: DC | PRN

## 2018-02-14 MED ORDER — DORZOLAMIDE HCL-TIMOLOL MAL 2-0.5 % OP SOLN
1.0000 [drp] | Freq: Two times a day (BID) | OPHTHALMIC | Status: DC
  Administered 2018-02-15 – 2018-02-16 (×3): 1 [drp] via OPHTHALMIC
  Filled 2018-02-14: qty 10

## 2018-02-14 MED ORDER — ACETAMINOPHEN 325 MG PO TABS: 650.0000 mg | ORAL_TABLET | Freq: Four times a day (QID) | ORAL | Status: DC | PRN

## 2018-02-14 MED ORDER — ALBUTEROL SULFATE (2.5 MG/3ML) 0.083% IN NEBU: 2.5000 mg | INHALATION_SOLUTION | RESPIRATORY_TRACT | Status: DC | PRN

## 2018-02-14 MED ORDER — AMLODIPINE BESYLATE 5 MG PO TABS
10.0000 mg | ORAL_TABLET | Freq: Once | ORAL | Status: AC
  Administered 2018-02-14: 10 mg via ORAL
  Filled 2018-02-14: qty 2

## 2018-02-14 MED ORDER — ATORVASTATIN CALCIUM 20 MG PO TABS
40.0000 mg | ORAL_TABLET | Freq: Every day | ORAL | Status: DC
  Administered 2018-02-15: 40 mg via ORAL
  Filled 2018-02-14: qty 2

## 2018-02-14 MED ORDER — SODIUM CHLORIDE 0.9% FLUSH: 3.0000 mL | INTRAVENOUS | Status: DC | PRN

## 2018-02-14 MED ORDER — NITROGLYCERIN 2 % TD OINT
1.0000 [in_us] | TOPICAL_OINTMENT | Freq: Once | TRANSDERMAL | Status: AC
  Administered 2018-02-14: 1 [in_us] via TOPICAL
  Filled 2018-02-14: qty 1

## 2018-02-14 MED ORDER — HEPARIN SODIUM (PORCINE) 5000 UNIT/ML IJ SOLN
5000.0000 [IU] | Freq: Three times a day (TID) | INTRAMUSCULAR | Status: DC
  Administered 2018-02-14 – 2018-02-16 (×5): 5000 [IU] via SUBCUTANEOUS
  Filled 2018-02-14 (×5): qty 1

## 2018-02-14 MED ORDER — TAMSULOSIN HCL 0.4 MG PO CAPS
0.4000 mg | ORAL_CAPSULE | Freq: Every day | ORAL | Status: DC
  Administered 2018-02-15 – 2018-02-16 (×2): 0.4 mg via ORAL
  Filled 2018-02-14 (×2): qty 1

## 2018-02-14 MED ORDER — ASPIRIN 81 MG PO CHEW
324.0000 mg | CHEWABLE_TABLET | Freq: Once | ORAL | Status: AC
  Administered 2018-02-14: 324 mg via ORAL
  Filled 2018-02-14: qty 4

## 2018-02-14 MED ORDER — DOXAZOSIN MESYLATE 2 MG PO TABS
2.0000 mg | ORAL_TABLET | Freq: Every day | ORAL | Status: DC
  Administered 2018-02-15 – 2018-02-16 (×2): 2 mg via ORAL
  Filled 2018-02-14 (×2): qty 1

## 2018-02-14 MED ORDER — SODIUM CHLORIDE 0.9 % IV SOLN: 250.0000 mL | INTRAVENOUS | Status: DC | PRN

## 2018-02-14 MED ORDER — SENNOSIDES-DOCUSATE SODIUM 8.6-50 MG PO TABS: 1.0000 | ORAL_TABLET | Freq: Every evening | ORAL | Status: DC | PRN

## 2018-02-14 MED ORDER — IPRATROPIUM-ALBUTEROL 0.5-2.5 (3) MG/3ML IN SOLN: 3.0000 mL | Freq: Once | RESPIRATORY_TRACT | Status: DC

## 2018-02-14 MED ORDER — ASPIRIN EC 325 MG PO TBEC
325.0000 mg | DELAYED_RELEASE_TABLET | Freq: Every day | ORAL | Status: DC
  Administered 2018-02-15 – 2018-02-16 (×2): 325 mg via ORAL
  Filled 2018-02-14 (×2): qty 1

## 2018-02-14 MED ORDER — AMLODIPINE BESYLATE 10 MG PO TABS
10.0000 mg | ORAL_TABLET | Freq: Every day | ORAL | Status: DC
  Administered 2018-02-15 – 2018-02-16 (×2): 10 mg via ORAL
  Filled 2018-02-14 (×2): qty 1

## 2018-02-14 MED ORDER — ALLOPURINOL 300 MG PO TABS
300.0000 mg | ORAL_TABLET | Freq: Every day | ORAL | Status: DC
  Administered 2018-02-15 – 2018-02-16 (×2): 300 mg via ORAL
  Filled 2018-02-14 (×2): qty 1

## 2018-02-14 MED ORDER — HYDROCODONE-ACETAMINOPHEN 5-325 MG PO TABS: 1.0000 | ORAL_TABLET | ORAL | Status: DC | PRN

## 2018-02-14 MED ORDER — NICOTINE 14 MG/24HR TD PT24
14.0000 mg | MEDICATED_PATCH | Freq: Every day | TRANSDERMAL | Status: DC
  Administered 2018-02-15 – 2018-02-16 (×2): 14 mg via TRANSDERMAL
  Filled 2018-02-14 (×2): qty 1

## 2018-02-14 MED ORDER — LISINOPRIL 10 MG PO TABS
10.0000 mg | ORAL_TABLET | Freq: Every day | ORAL | Status: DC
  Administered 2018-02-15: 10 mg via ORAL
  Filled 2018-02-14: qty 1

## 2018-02-14 MED ORDER — FUROSEMIDE 10 MG/ML IJ SOLN
40.0000 mg | Freq: Two times a day (BID) | INTRAMUSCULAR | Status: DC
  Administered 2018-02-14 – 2018-02-15 (×3): 40 mg via INTRAVENOUS
  Filled 2018-02-14 (×3): qty 4

## 2018-02-14 MED ORDER — ACETAMINOPHEN 650 MG RE SUPP: 650.0000 mg | Freq: Four times a day (QID) | RECTAL | Status: DC | PRN

## 2018-02-14 MED ORDER — FUROSEMIDE 10 MG/ML IJ SOLN
60.0000 mg | Freq: Once | INTRAMUSCULAR | Status: AC
  Administered 2018-02-14: 60 mg via INTRAVENOUS
  Filled 2018-02-14: qty 8

## 2018-02-14 MED ORDER — FINASTERIDE 5 MG PO TABS
5.0000 mg | ORAL_TABLET | Freq: Every day | ORAL | Status: DC
  Administered 2018-02-15 – 2018-02-16 (×2): 5 mg via ORAL
  Filled 2018-02-14 (×2): qty 1

## 2018-02-14 MED ORDER — LABETALOL HCL 5 MG/ML IV SOLN: 10.0000 mg | INTRAVENOUS | Status: DC | PRN

## 2018-02-14 MED ORDER — PNEUMOCOCCAL VAC POLYVALENT 25 MCG/0.5ML IJ INJ
0.5000 mL | INJECTION | INTRAMUSCULAR | Status: AC
  Administered 2018-02-15: 0.5 mL via INTRAMUSCULAR
  Filled 2018-02-14: qty 0.5

## 2018-02-14 MED ORDER — ONDANSETRON HCL 4 MG/2ML IJ SOLN: 4.0000 mg | Freq: Four times a day (QID) | INTRAMUSCULAR | Status: DC | PRN

## 2018-02-14 NOTE — ED Notes (Signed)
Pt eating a sandwich tray

## 2018-02-14 NOTE — H&P (Signed)
[facility] [place] [facility]   PATIENT NAME: Maurice Sullivan    MR#:  [id number]  DATE OF BIRTH:  1946/01/02  DATE OF ADMISSION:  02/14/2018  PRIMARY CARE PHYSICIAN: Treasa SchoolLand, Phillip, PA-C   REQUESTING/REFERRING PHYSICIAN: Dr. Roxan Hockeyobinson  CHIEF COMPLAINT:   Chief Complaint  Patient presents with  . Hypertension  . Chest Pain  . Shortness of Breath   Chest pain and shortness of breath for 3 days. HISTORY OF PRESENT ILLNESS:  Abdulwahab Maurice Sullivan  is a 72 y.o. male with a known history of hypertension, hyperlipidemia, CVA, gout, anxiety, depression and arthritis.  The patient has had an controlled high blood pressure for the past 1 months.  He has worsening chest discomfort, shortness of breath, cough and wheezing for the past 3 days.  He also complains of orthopnea and nocturnal dyspnea but no leg edema.  He denies any fever or chills, no nausea or diaphoresis.  He is found hypoxia pressure at 208/67.  Chest x-ray show right-sided pleural effusion and CT angiogram of the chest show Moderate right and small left pleural effusions with evidence of very mild interstitial pulmonary edema in the lungs but no PE.  He is treated with IV labetalol, IV Lasix, Norvasc and Nitropaste in the ED.  PAST MEDICAL HISTORY:   Past Medical History:  Diagnosis Date  . Anxiety   . Arthritis   . CVA (cerebral infarction)   . Depression   . Glaucoma   . Gout   . Heart disease   . Hyperlipemia   . Hypertension     PAST SURGICAL HISTORY:   Past Surgical History:  Procedure Laterality Date  . LEG SURGERY  1979    SOCIAL HISTORY:   Social History   Tobacco Use  . Smoking status: Current Every Day Smoker    Packs/day: 0.50    Types: Cigarettes  . Smokeless tobacco: Never Used  Substance Use Topics  . Alcohol use: Yes    Alcohol/week: 0.0 standard drinks    Comment: holidays    FAMILY HISTORY:   Family History  Problem Relation Age of Onset  . Prostate cancer  Brother   . Stroke Mother   . Colon cancer Mother   . Stroke Father     DRUG ALLERGIES:  No Known Allergies  REVIEW OF SYSTEMS:   Review of Systems  Constitutional: Negative for chills, fever and malaise/fatigue.  HENT: Negative for sore throat.   Eyes: Negative for blurred vision and double vision.  Respiratory: Positive for cough, shortness of breath and wheezing. Negative for hemoptysis, sputum production and stridor.   Cardiovascular: Positive for chest pain and orthopnea. Negative for palpitations and leg swelling.  Gastrointestinal: Negative for abdominal pain, blood in stool, diarrhea, melena, nausea and vomiting.  Genitourinary: Negative for dysuria, flank pain and hematuria.  Musculoskeletal: Negative for back pain and joint pain.  Skin: Negative for rash.  Neurological: Negative for dizziness, sensory change, focal weakness, seizures, loss of consciousness, weakness and headaches.  Endo/Heme/Allergies: Negative for polydipsia.  Psychiatric/Behavioral: Negative for depression. The patient is not nervous/anxious.     MEDICATIONS AT HOME:   Prior to Admission medications   Medication Sig Start Date End Date Taking? Authorizing Provider  allopurinol (ZYLOPRIM) 300 MG tablet Take 300 mg by mouth daily.   Yes [provider]  amLODipine (NORVASC) 10 MG tablet Take 10 mg by mouth daily. 07/27/17  Yes [provider]  atorvastatin (LIPITOR) 40 MG tablet Take 40 mg by  mouth daily. 07/27/17  Yes [provider]  clopidogrel (PLAVIX) 75 MG tablet Take 1 tablet (75 mg total) by mouth daily. 09/21/17  Yes Levert Feinstein, MD  dorzolamide-timolol (COSOPT) 22.3-6.8 MG/ML ophthalmic solution Place 1 drop into the left eye 2 (two) times daily.  08/09/15  Yes [provider]  doxazosin (CARDURA) 2 MG tablet Take 2 mg by mouth daily.   Yes [provider]  finasteride (PROSCAR) 5 MG tablet Take 5 mg by mouth daily. 01/29/18  Yes [provider]    lisinopril (PRINIVIL,ZESTRIL) 10 MG tablet Take 10 mg by mouth daily.   Yes [provider]  tamsulosin (FLOMAX) 0.4 MG CAPS capsule Take 1 capsule by mouth daily. 01/29/18  Yes [provider]  sildenafil (VIAGRA) 100 MG tablet Take 0.5 tablets (50 mg total) by mouth daily as needed for erectile dysfunction (Take 1-5 tablets as needed 1 hour prior to sexual activity). 08/24/15   Vanna Scotland, MD      VITAL SIGNS:  Blood pressure (!) 206/64, pulse 92, temperature 98.2 F (36.8 C), temperature source Oral, resp. rate (!) 26, height 5\' 6"  (1.676 m), weight 77.1 kg, SpO2 98 %.  PHYSICAL EXAMINATION:  Physical Exam  GENERAL:  72 y.o.-year-old patient lying in the bed with no acute distress.  EYES: Pupils equal, round, reactive to light and accommodation. No scleral icterus. Extraocular muscles intact.  HEENT: Head atraumatic, normocephalic. Oropharynx and nasopharynx clear.  NECK:  Supple, no jugular venous distention. No thyroid enlargement, no tenderness.  LUNGS: Mild wheezing, right basilar rales, no rhonchi or crepitation. No use of accessory muscles of respiration.  CARDIOVASCULAR: S1, S2 normal. No murmurs, rubs, or gallops.  ABDOMEN: Soft, nontender, nondistended. Bowel sounds present. No organomegaly or mass.  EXTREMITIES: Trace pedal edema, no cyanosis, or clubbing.  NEUROLOGIC: Cranial nerves II through XII are intact. Muscle strength 5/5 in all extremities. Sensation intact. Gait not checked.  PSYCHIATRIC: The patient is alert and oriented x 3.  SKIN: No obvious rash, lesion, or ulcer.   LABORATORY PANEL:   CBC Recent Labs  Lab 02/14/18 1929 02/14/18 2048  WBC 9.6  --   HGB 11.3* 9.2*  HCT 34.4* 27.0*  PLT 316  --    ------------------------------------------------------------------------------------------------------------------  Chemistries  Recent Labs  Lab 02/14/18 1929 02/14/18 2048  NA 142 142  K 3.7 3.9  CL 114* 117*  CO2 19*  --    GLUCOSE 105* 100*  BUN 21 18  CREATININE 1.79* 2.00*  CALCIUM 8.1*  --   AST 20  --   ALT 12  --   ALKPHOS 98  --   BILITOT 0.5  --    ------------------------------------------------------------------------------------------------------------------  Cardiac Enzymes Recent Labs  Lab 02/14/18 1929  TROPONINI 0.08*   ------------------------------------------------------------------------------------------------------------------  RADIOLOGY:  Dg Chest 2 View  Result Date: 02/14/2018 CLINICAL DATA:  Chest pain for 3 days, dyspnea. EXAM: CHEST - 2 VIEW COMPARISON:  Chest radiograph Jul 24, 2017 FINDINGS: RIGHT lung base consolidation with small RIGHT pleural effusion. Mild bronchitic changes. Mild cardiomegaly. Mediastinal silhouette is not suspicious. No pneumothorax. Moderate degenerative changes spine. IMPRESSION: RIGHT lung base consolidation with small RIGHT pleural effusion concerning for bronchopneumonia. Followup PA and lateral chest X-ray is recommended in 3-4 weeks following trial of antibiotic therapy to ensure resolution and exclude underlying malignancy. Stable mild cardiomegaly. Electronically Signed   By: Awilda Metro M.D.   On: 02/14/2018 20:14   Ct Chest Wo Contrast  Result Date: 02/14/2018 CLINICAL DATA:  72 year old male with history of chest pain for the past 3 days. Dyspnea. EXAM: CT CHEST WITHOUT CONTRAST TECHNIQUE: Multidetector CT imaging of the chest was performed following the standard protocol without IV contrast. COMPARISON:  Chest CT 10/27/2010. FINDINGS: Cardiovascular: Heart size is mildly enlarged. There is no significant pericardial fluid, thickening or pericardial calcification. There is aortic atherosclerosis, as well as atherosclerosis of the great vessels of the mediastinum and the coronary arteries, including calcified atherosclerotic plaque in the left main, left anterior descending and right coronary arteries. Mediastinum/Nodes: Multiple prominent  borderline enlarged mediastinal and hilar lymph nodes are noted, nonspecific. Esophagus is unremarkable in appearance. No axillary lymphadenopathy. Lungs/Pleura: Moderate right and small left pleural effusions. Some associated passive atelectasis in the right lung base. No acute consolidative airspace disease. No pleural effusions. Subpleural nodule measuring 7 x 5 mm (mean diameter of 6 mm) in the right middle lobe associated with the minor fissure, similar to the prior examination, considered benign (presumably a subpleural lymph node). No other definite suspicious appearing pulmonary nodules or masses are noted. Mild diffuse interlobular septal thickening, favored to reflect mild interstitial pulmonary edema. Diffuse bronchial wall thickening with mild centrilobular and paraseptal emphysema. Upper Abdomen: Aortic atherosclerosis. Musculoskeletal: There are no aggressive appearing lytic or blastic lesions noted in the visualized portions of the skeleton. IMPRESSION: 1. Moderate right and small left pleural effusions with evidence of very mild interstitial pulmonary edema in the lungs. Given the presence of mild cardiomegaly, findings are favored to reflect congestive heart failure. 2. Aortic atherosclerosis, in addition to left main and 2 vessel coronary artery disease. Assessment for potential risk factor modification, dietary therapy or pharmacologic therapy may be warranted, if clinically indicated. 3. Mild diffuse bronchial thickening with mild centrilobular and paraseptal emphysema; imaging findings suggestive of underlying COPD. Aortic Atherosclerosis (ICD10-I70.0) and Emphysema (ICD10-J43.9). Electronically Signed   By: Trudie Reed M.D.   On: 02/14/2018 21:44      IMPRESSION AND PLAN:   Hypertension emergency. The patient will be admitted to telemetry floor. Continue hypertension medication, nitro patch, IV labetalol as needed.  Acute diastolic CHF, possible related to uncontrolled  hypertension.  Ejection fraction 50 to 55% this year. Start Lasix IV twice daily, cardiology consult.  Chest pain with elevated troponin due to demanding ischemia secondary above. Follow-up troponin level, aspirin, Plavix and Lipitor.  CKD stage III.  Stable.  Anemia.  Unclear etiology.  Hemoglobin decreased from 11.3 to 9.2 in ED in 1h.  No active bleeding.  Possible due to error.  Repeat CBC tomorrow.  Tobacco abuse.  Smoking cessation was counseled for 3 to 4 minutes, nicotine patch. All the records are reviewed and case discussed with ED provider. Management plans discussed with the patient, his daughter and they are in agreement.  CODE STATUS: Full code  TOTAL TIME TAKING CARE OF THIS PATIENT: 37 minutes.    Shaune Pollack M.D on 02/14/2018 at 9:56 PM  Between 7am to 6pm - Pager - [phone]  After 6pm go to [email or web address] - Social research officer, government  [facility]  Office  [phone]  CC: Primary care physician; Treasa School, PA-C   Note: This dictation was prepared with Dragon dictation along with smaller phrase technology. Any transcriptional errors that result from this process are unin

## 2018-02-14 NOTE — ED Notes (Signed)
Report called to 2c 

## 2018-02-14 NOTE — ED Triage Notes (Signed)
Pt called ems for cp x 3 days, dyspnea same time but neither symptoms on arrival. Pt called ems to be "checked out". Hypertensive.

## 2018-02-14 NOTE — ED Notes (Signed)
Transporting patient to 234.

## 2018-02-14 NOTE — ED Notes (Signed)
Lab called - their machine was down for 45 mins. The green tube hemolyzed and needs redrawn. Dr made aware.

## 2018-02-14 NOTE — ED Notes (Signed)
Pt states his bp has been running high x 1 month and his dr is aware. He is compliant with his bp meds.

## 2018-02-14 NOTE — ED Provider Notes (Signed)
[facility] Emergency Department Provider Note    First MD Initiated Contact with Patient 02/14/18 1928     (approximate)  I have reviewed the triage vital signs and the nursing notes.   HISTORY  Chief Complaint Hypertension; Chest Pain; and Shortness of Breath    HPI Maurice Sullivan is a 72 y.o. male with below listed past medical  history presents to the ER for concern of her elevated blood pressure.  States his blood pressures been high for 1 month and over the past 3 days is developed worsening shortness of breath as well as chest discomfort.  Denies any chest pain right now but does endorse worsening orthopnea as well as exertional dyspnea.  States he is noted some increased swelling in his legs.  Denies any recent medication changes.  Denies any headache or blurry vision.  Echo 7/19: Study Conclusions  - Left ventricle: The cavity size was normal. Wall thickness was   increased in a pattern of mild LVH. Systolic function was normal.   The estimated ejection fraction was in the range of 50% to 55%.   Wall motion was normal; there were no regional wall motion   abnormalities. Doppler parameters are consistent with abnormal   left ventricular relaxation (grade 1 diastolic dysfunction). - Aortic valve: There was moderate regurgitation. Regurgitation   pressure half-time: 362 ms. - Mitral valve: There was mild regurgitation. - Right ventricle: Systolic function was normal. - Atrial septum: No defect or patent foramen ovale was identified. - Tricuspid valve: There was trivial regurgitation. - Pulmonic valve: There was no significant regurgitation.  Impressions:  - Normal LV EF. Moderate aortic regurgitation.  Past Medical History:  Diagnosis Date  . Anxiety   . Arthritis   . CVA (cerebral infarction)   . Depression   . Glaucoma   . Gout   . Heart disease   . Hyperlipemia   . Hypertension    Family History  Problem Relation Age of  Onset  . Prostate cancer Brother   . Stroke Mother   . Colon cancer Mother   . Stroke Father    Past Surgical History:  Procedure Laterality Date  . LEG SURGERY  1979   Patient Active Problem List   Diagnosis Date Noted  . Cerebrovascular accident (CVA) (HCC) 09/21/2017  . Benign prostatic hyperplasia with urinary obstruction 06/28/2015  . FOM (frequency of micturition) 04/27/2014  . 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 Adynamia 10/17/2013  . Personal history of healed traumatic fracture 10/17/2013  . Cerebral infarction (HCC) 05/26/2013  . Acid reflux 05/26/2013  . Clinical depression 11/03/2012  . Hypercholesterolemia 11/03/2012  . BP (high blood pressure) 11/03/2012  . Primary open angle glaucoma 05/27/2011  . Cataract, nuclear sclerotic senile 05/27/2011  . Glaucoma 05/27/2011  . History of colon polyps 08/29/2009  . Abnormal colonoscopy 07/13/2006      Prior to Admission medications   Medication Sig Start Date End Date Taking? Authorizing Provider  allopurinol (ZYLOPRIM) 300 MG tablet Take 300 mg by mouth daily.    [provider]  amLODipine (NORVASC) 10 MG tablet Take 10 mg by mouth daily. 07/27/17   [provider]  atorvastatin (LIPITOR) 40 MG tablet Take 40 mg by mouth daily. 07/27/17   [provider]  clopidogrel (PLAVIX) 75 MG tablet Take 1 tablet (75 mg total) by mouth daily. 09/21/17   Levert Feinstein, MD  dorzolamide-timolol (COSOPT) 22.3-6.8 MG/ML ophthalmic solution Place 1 drop into the left eye 2 (two)  times daily.  08/09/15   [provider]  doxazosin (CARDURA) 2 MG tablet Take 2 mg by mouth daily.    [provider]  lisinopril (PRINIVIL,ZESTRIL) 10 MG tablet Take 10 mg by mouth daily.    [provider]  sildenafil (VIAGRA) 100 MG tablet Take 0.5 tablets (50 mg total) by mouth daily as needed for erectile dysfunction (Take 1-5 tablets as needed 1 hour prior to sexual activity). 08/24/15   Vanna Scotland, MD     Allergies Patient has no known allergies.    Social History Social History   Tobacco Use  . Smoking status: Current Every Day Smoker    Packs/day: 0.50    Types: Cigarettes  . Smokeless tobacco: Never Used  Substance Use Topics  . Alcohol use: Yes    Alcohol/week: 0.0 standard drinks    Comment: holidays  . Drug use: No    Review of Systems Patient denies headaches, rhinorrhea, blurry vision, numbness, shortness of breath, chest pain, edema, cough, abdominal pain, nausea, vomiting, diarrhea, dysuria, fevers, rashes or hallucinations unless otherwise stated above in HPI. ____________________________________________   PHYSICAL EXAM:  VITAL SIGNS: Vitals:   02/14/18 2030 02/14/18 2100  BP: (!) 200/68 (!) 208/67  Pulse: 90 92  Resp: (!) 29 (!) 28  Temp:    SpO2: 99% 98%    Constitutional: Alert and oriented.  Eyes: Conjunctivae are normal.  Head: Atraumatic. Nose: No congestion/rhinnorhea. Mouth/Throat: Mucous membranes are moist.   Neck: No stridor. Painless ROM.  Cardiovascular: Normal rate, regular rhythm. Grossly normal heart sounds.  Good peripheral circulation. Respiratory: Normal respiratory effort.  No retractions. Diminished breath sounds posteriorly with some inspiratory crackles noted Gastrointestinal: Soft and nontender. No distention. No abdominal bruits. No CVA tenderness. Genitourinary:  Musculoskeletal: No lower extremity tenderness.  1+ pitting edema.  No joint effusions. Neurologic:  Normal speech and language. No gross focal neurologic deficits are appreciated. No facial droop Skin:  Skin is warm, dry and intact. No rash noted. Psychiatric: Mood and affect are normal. Speech and behavior are normal.  ____________________________________________   LABS (all labs ordered are listed, but only abnormal results are displayed)  Results for orders placed or performed during the hospital encounter of 02/14/18 (from the past 24 hour(s))  CBC with  Differential/Platelet     Status: Abnormal   Collection Time: 02/14/18  7:29 PM  Result Value Ref Range   WBC 9.6 4.0 - 10.5 K/uL   RBC 3.77 (L) 4.22 - 5.81 MIL/uL   Hemoglobin 11.3 (L) 13.0 - 17.0 g/dL   HCT 16.1 (L) 09.6 - 04.5 %   MCV 91.2 80.0 - 100.0 fL   MCH 30.0 26.0 - 34.0 pg   MCHC 32.8 30.0 - 36.0 g/dL   RDW 40.9 (H) 81.1 - 91.4 %   Platelets 316 150 - 400 K/uL   nRBC 0.0 0.0 - 0.2 %   Neutrophils Relative % 71 %   Neutro Abs 6.8 1.7 - 7.7 K/uL   Lymphocytes Relative 21 %   Lymphs Abs 2.0 0.7 - 4.0 K/uL   Monocytes Relative 6 %   Monocytes Absolute 0.6 0.1 - 1.0 K/uL   Eosinophils Relative 1 %   Eosinophils Absolute 0.1 0.0 - 0.5 K/uL   Basophils Relative 0 %   Basophils Absolute 0.0 0.0 - 0.1 K/uL   Immature Granulocytes 1 %   Abs Immature Granulocytes 0.05 0.00 - 0.07 K/uL  Comprehensive metabolic panel     Status: Abnormal   Collection  Time: 02/14/18  7:29 PM  Result Value Ref Range   Sodium 142 135 - 145 mmol/L   Potassium 3.7 3.5 - 5.1 mmol/L   Chloride 114 (H) 98 - 111 mmol/L   CO2 19 (L) 22 - 32 mmol/L   Glucose, Bld 105 (H) 70 - 99 mg/dL   BUN 21 8 - 23 mg/dL   Creatinine, Ser 6.96 (H) 0.61 - 1.24 mg/dL   Calcium 8.1 (L) 8.9 - 10.3 mg/dL   Total Protein 6.2 (L) 6.5 - 8.1 g/dL   Albumin 2.5 (L) 3.5 - 5.0 g/dL   AST 20 15 - 41 U/L   ALT 12 0 - 44 U/L   Alkaline Phosphatase 98 38 - 126 U/L   Total Bilirubin 0.5 0.3 - 1.2 mg/dL   GFR calc non Af Amer 36 (L) >60 mL/min   GFR calc Af Amer 42 (L) >60 mL/min   Anion gap 9 5 - 15  Troponin I -     Status: Abnormal   Collection Time: 02/14/18  7:29 PM  Result Value Ref Range   Troponin I 0.08 (HH) <0.03 ng/mL  I-STAT, chem 8     Status: Abnormal   Collection Time: 02/14/18  8:48 PM  Result Value Ref Range   Sodium 142 135 - 145 mmol/L   Potassium 3.9 3.5 - 5.1 mmol/L   Chloride 117 (H) 98 - 111 mmol/L   BUN 18 8 - 23 mg/dL   Creatinine, Ser 2.95 (H) 0.61 - 1.24 mg/dL   Glucose, Bld 284 (H) 70 - 99  mg/dL   Calcium, Ion 1.32 4.40 - 1.40 mmol/L   TCO2 21 (L) 22 - 32 mmol/L   Hemoglobin 9.2 (L) 13.0 - 17.0 g/dL   HCT 10.2 (L) 72.5 - 36.6 %   ____________________________________________  EKG My review and personal interpretation at Time: 19:24   Indication: htn  Rate: 90  Rhythm: sinus Axis: normal Other: normal intervals, lvh criteria, grossly unchanged as compared to previous tracing 03/12/17 ____________________________________________  RADIOLOGY  I personally reviewed all radiographic images ordered to evaluate for the above acute complaints and reviewed radiology reports and findings.  These findings were personally discussed with the patient.  Please see medical record for radiology report.  ____________________________________________   PROCEDURES  Procedure(s) performed:  .Critical Care Performed by: Willy Eddy, MD Authorized by: Willy Eddy, MD   Critical care provider statement:    Critical care time (minutes):  30   Critical care time was exclusive of:  Separately billable procedures and treating other patients   Critical care was necessary to treat or prevent imminent or life-threatening deterioration of the following conditions:  Cardiac failure and respiratory failure   Critical care was time spent personally by me on the following activities:  Development of treatment plan with patient or surrogate, discussions with consultants, evaluation of patient's response to treatment, examination of patient, obtaining history from patient or surrogate, ordering and performing treatments and interventions, ordering and review of laboratory studies, ordering and review of radiographic studies, pulse oximetry, re-evaluation of patient's condition and review of old charts      Critical Care performed: no ____________________________________________   INITIAL IMPRESSION / ASSESSMENT AND PLAN / ED COURSE  Pertinent labs & imaging results that were available  during my care of the patient were reviewed by me and considered in my medical decision making (see chart for details).   DDX: Asthma, copd, CHF, pna, ptx, malignancy, Pe, anemia   Maurice Sullivan  is a 72 y.o. who presents to the ED with shortness of breath hypertension as well as some intermittent chest discomfort.  He is currently chest pain-free but endorsing shortness of breath.  Is afebrile significantly hypertensive.  Initial chest x-ray ordered for the above differential does show evidence of right-sided effusion but initially read as pneumonia.  Clinically seems less consistent with pneumonia he has no fever and no leukocytosis.  Certainly given his age malignancy is on the differential therefore CT imaging will be ordered to further characterize.  I have a much higher suspicion for hypertensive urgency and congestive heart failure.  His troponin is mildly elevated.  EKG is consistent with previous therefore have a higher suspicion that this is secondary to some renal insufficiency and demand ischemia in the setting of worsening heart failure nevertheless will give aspirin.  Denies any chest pain at this time therefore we will hold off on heparin.  Given his symptoms do feel patient will require hospitalization for evaluation of new onset heart failure with diuresis and further blood pressure management.      As part of my medical decision making, I reviewed the following data within the electronic MEDICAL RECORD NUMBER Nursing notes reviewed and incorporated, Labs reviewed, notes from prior ED visits.  ____________________________________________   FINAL CLINICAL IMPRESSION(S) / ED DIAGNOSES  Final diagnoses:  Shortness of breath  Pleural effusion      NEW MEDICATIONS STARTED DURING THIS VISIT:  New Prescriptions   No medications on file     Note:  This document was prepared using Dragon voice recognition software and may include unintentional dictation errors.    Willy Eddyobinson,  Brytani Voth, MD 02/14/18 2124

## 2018-02-14 NOTE — Progress Notes (Signed)
Advanced Care Plan.  Purpose of Encounter: CODE STATUS. Parties in Attendance: The patient, his daughter and the niece, me. Patient's Decisional Capacity: Yes Medical Story: Maurice Sullivan  is a 72 y.o. male with a known history of hypertension, hyperlipidemia, CVA, gout, anxiety, depression and arthritis.  The patient is being admitted for hypertension emergency and acute diastolic CHF.  I discussed with the patient about his current condition, prognosis and CODE STATUS.  The patient does want to be resuscitated and intubated if he has cardiopulmonary arrest. Plan:  Code Status: Full code. Time spent discussing advance care planning: 18 minutes.

## 2018-02-15 LAB — BASIC METABOLIC PANEL
Anion gap: 9 (ref 5–15)
BUN: 20 mg/dL (ref 8–23)
CHLORIDE: 112 mmol/L — AB (ref 98–111)
CO2: 22 mmol/L (ref 22–32)
Calcium: 8.4 mg/dL — ABNORMAL LOW (ref 8.9–10.3)
Creatinine, Ser: 1.74 mg/dL — ABNORMAL HIGH (ref 0.61–1.24)
GFR calc Af Amer: 43 mL/min — ABNORMAL LOW (ref >60)
GFR, EST NON AFRICAN AMERICAN: 37 mL/min — AB (ref >60)
GLUCOSE: 102 mg/dL — AB (ref 70–99)
POTASSIUM: 3.2 mmol/L — AB (ref 3.5–5.1)
SODIUM: 143 mmol/L (ref 135–145)

## 2018-02-15 LAB — CBC
HCT: 34.1 % — ABNORMAL LOW (ref 39.0–52.0)
HEMOGLOBIN: 11.3 g/dL — AB (ref 13.0–17.0)
MCH: 30 pg (ref 26.0–34.0)
MCHC: 33.1 g/dL (ref 30.0–36.0)
MCV: 90.5 fL (ref 80.0–100.0)
Platelets: 288 10*3/uL (ref 150–400)
RBC: 3.77 MIL/uL — ABNORMAL LOW (ref 4.22–5.81)
RDW: 15.9 % — ABNORMAL HIGH (ref 11.5–15.5)
WBC: 7.8 10*3/uL (ref 4.0–10.5)
nRBC: 0 % (ref 0.0–0.2)

## 2018-02-15 LAB — TROPONIN I
TROPONIN I: 0.08 ng/mL — AB (ref <0.03)
Troponin I: 0.13 ng/mL (ref <0.03)

## 2018-02-15 LAB — MAGNESIUM: MAGNESIUM: 2.1 mg/dL (ref 1.7–2.4)

## 2018-02-15 MED ORDER — POTASSIUM CHLORIDE CRYS ER 20 MEQ PO TBCR
40.0000 meq | EXTENDED_RELEASE_TABLET | Freq: Two times a day (BID) | ORAL | Status: DC
  Administered 2018-02-15 – 2018-02-16 (×3): 40 meq via ORAL
  Filled 2018-02-15 (×3): qty 2

## 2018-02-15 MED ORDER — CARVEDILOL 6.25 MG PO TABS
6.2500 mg | ORAL_TABLET | Freq: Two times a day (BID) | ORAL | Status: DC
  Administered 2018-02-15 – 2018-02-16 (×3): 6.25 mg via ORAL
  Filled 2018-02-15 (×3): qty 1

## 2018-02-15 NOTE — Consult Note (Signed)
Cardiology Consultation Note    Patient ID: Maurice Sullivan, MRN: [id number], DOB/AGE: 73-May-1947 73 y.o. Admit date: 02/14/2018   Date of Consult: 02/15/2018 Primary Physician: Treasa School, PA-C Primary Cardiologist: none  Chief Complaint: dizzyness Reason for Consultation: heart failure Requesting MD: Dr. Nancy Marus  HPI: Maurice Sullivan is a 72 y.o. male with history of hypertension, hyperlipidemia clinical: History of CVA with evaluation by neurology recently.  He was admitted with an episode of dizziness.  Consultation called for heart failure.  Patient had carotid Dopplers done recently which showed less than 40% stenosis bilaterally.  Echocardiogram done at [facility] in July 2019 was read as showing normal LV function with an EF of 50 to 55% with moderate aortic insufficiency, normal wall motion, mild LVH, with abnormal left ventricular relaxation consistent with grade 1 diastolic dysfunction.  Patient is ruled out for myocardial infarction a serum troponin of 0.082.  Serum potassium was 3.2 this morning.  On admission potassium is 3.9.  She has chronic renal insufficiency with a creatinine of 2.0 on presentation down to 1.74 currently which appears to be near his baseline.  Chest x-ray revealed right lung base consolidation with a small right pleural effusion consistent with bronchopneumonia.  Stable cardiomegaly.  No obvious pulmonary edema.  Chest CT revealed mild right and small left pleural effusions with very mild interstitial edema in the lungs.  Feeling was possible mild congestive heart failure.  BNP was 791.  Previous value is not available.  EKG revealed sinus rhythm with no ischemia.  Patient has diuresed approximately 2.1 L since admission.  Is currently on amlodipine at 10 mg daily, aspirin 325 mg daily, atorvastatin 40 daily, carvedilol 6.25 mg twice daily, Plavix 75 mg daily, Cardura 2 mg daily, Lasix IV. Past Medical History:  Diagnosis Date  . Anxiety    . Arthritis   . CVA (cerebral infarction)   . Depression   . Glaucoma   . Gout   . Heart disease   . Hyperlipemia   . Hypertension       Surgical History:  Past Surgical History:  Procedure Laterality Date  . LEG SURGERY  1979     Home Meds: Prior to Admission medications   Medication Sig Start Date End Date Taking? Authorizing Provider  allopurinol (ZYLOPRIM) 300 MG tablet Take 300 mg by mouth daily.   Yes [provider]  amLODipine (NORVASC) 10 MG tablet Take 10 mg by mouth daily. 07/27/17  Yes [provider]  atorvastatin (LIPITOR) 40 MG tablet Take 40 mg by mouth daily. 07/27/17  Yes [provider]  clopidogrel (PLAVIX) 75 MG tablet Take 1 tablet (75 mg total) by mouth daily. 09/21/17  Yes Levert Feinstein, MD  dorzolamide-timolol (COSOPT) 22.3-6.8 MG/ML ophthalmic solution Place 1 drop into the left eye 2 (two) times daily.  08/09/15  Yes [provider]  doxazosin (CARDURA) 2 MG tablet Take 2 mg by mouth daily.   Yes [provider]  finasteride (PROSCAR) 5 MG tablet Take 5 mg by mouth daily. 01/29/18  Yes [provider]  lisinopril (PRINIVIL,ZESTRIL) 10 MG tablet Take 10 mg by mouth daily.   Yes [provider]  tamsulosin (FLOMAX) 0.4 MG CAPS capsule Take 1 capsule by mouth daily. 01/29/18  Yes [provider]  sildenafil (VIAGRA) 100 MG tablet Take 0.5 tablets (50 mg total) by mouth daily as needed for erectile dysfunction (Take 1-5 tablets as needed 1 hour prior to sexual activity). 08/24/15  Vanna Scotland, MD    Inpatient Medications:  . allopurinol  300 mg Oral Daily  . amLODipine  10 mg Oral Daily  . aspirin EC  325 mg Oral Daily  . atorvastatin  40 mg Oral Daily  . carvedilol  6.25 mg Oral BID WC  . clopidogrel  75 mg Oral Daily  . dorzolamide-timolol  1 drop Left Eye BID  . doxazosin  2 mg Oral Daily  . finasteride  5 mg Oral Daily  . furosemide  40 mg Intravenous Q12H  . heparin  5,000 Units  Subcutaneous Q8H  . ipratropium-albuterol  3 mL Nebulization Once  . lisinopril  10 mg Oral Daily  . nicotine  14 mg Transdermal Daily  . potassium chloride  40 mEq Oral BID  . sodium chloride flush  3 mL Intravenous Q12H  . tamsulosin  0.4 mg Oral Daily   . sodium chloride      Allergies: No Known Allergies  Social History   Socioeconomic History  . Marital status: Married    Spouse name: Not on file  . Number of children: 3  . Years of education: 11th grade  . Highest education level: Not on file  Occupational History  . Occupation: Retired Network engineer  . Financial resource strain: Not on file  . Food insecurity:    Worry: Not on file    Inability: Not on file  . Transportation needs:    Medical: Not on file    Non-medical: Not on file  Tobacco Use  . Smoking status: Current Every Day Smoker    Packs/day: 0.50    Types: Cigarettes  . Smokeless tobacco: Never Used  Substance and Sexual Activity  . Alcohol use: Yes    Alcohol/week: 0.0 standard drinks    Comment: holidays  . Drug use: No  . Sexual activity: Not on file  Lifestyle  . Physical activity:    Days per week: Not on file    Minutes per session: Not on file  . Stress: Not on file  Relationships  . Social connections:    Talks on phone: Not on file    Gets together: Not on file    Attends religious service: Not on file    Active member of club or organization: Not on file    Attends meetings of clubs or organizations: Not on file    Relationship status: Not on file  . Intimate partner violence:    Fear of current or ex partner: Not on file    Emotionally abused: Not on file    Physically abused: Not on file    Forced sexual activity: Not on file  Other Topics Concern  . Not on file  Social History Narrative   Lives at home with his wife and son.   Right-handed.   2 cups caffeine daily.     Family History  Problem Relation Age of Onset  . Prostate cancer Brother   . Stroke  Mother   . Colon cancer Mother   . Stroke Father      Review of Systems: A 12-system review of systems was performed and is negative except as noted in the HPI.  Labs: Recent Labs    02/14/18 1929 02/14/18 2317  TROPONINI 0.08* 0.08*   Lab Results  Component Value Date   WBC 7.8 02/15/2018   HGB 11.3 (L) 02/15/2018   HCT 34.1 (L) 02/15/2018   MCV 90.5 02/15/2018   PLT 288 02/15/2018  Recent Labs  Lab 02/14/18 1929  02/15/18 0412  NA 142   < > 143  K 3.7   < > 3.2*  CL 114*   < > 112*  CO2 19*  --  22  BUN 21   < > 20  CREATININE 1.79*   < > 1.74*  CALCIUM 8.1*  --  8.4*  PROT 6.2*  --   --   BILITOT 0.5  --   --   ALKPHOS 98  --   --   ALT 12  --   --   AST 20  --   --   GLUCOSE 105*   < > 102*   < > = values in this interval not displayed.   No results found for: CHOL, HDL, LDLCALC, TRIG No results found for: DDIMER  Radiology/Studies:  Dg Chest 2 View  Result Date: 02/14/2018 CLINICAL DATA:  Chest pain for 3 days, dyspnea. EXAM: CHEST - 2 VIEW COMPARISON:  Chest radiograph Jul 24, 2017 FINDINGS: RIGHT lung base consolidation with small RIGHT pleural effusion. Mild bronchitic changes. Mild cardiomegaly. Mediastinal silhouette is not suspicious. No pneumothorax. Moderate degenerative changes spine. IMPRESSION: RIGHT lung base consolidation with small RIGHT pleural effusion concerning for bronchopneumonia. Followup PA and lateral chest X-ray is recommended in 3-4 weeks following trial of antibiotic therapy to ensure resolution and exclude underlying malignancy. Stable mild cardiomegaly. Electronically Signed   By: Awilda Metro M.D.   On: 02/14/2018 20:14   Ct Chest Wo Contrast  Result Date: 02/14/2018 CLINICAL DATA:  72 year old male with history of chest pain for the past 3 days. Dyspnea. EXAM: CT CHEST WITHOUT CONTRAST TECHNIQUE: Multidetector CT imaging of the chest was performed following the standard protocol without IV contrast. COMPARISON:  Chest CT  10/27/2010. FINDINGS: Cardiovascular: Heart size is mildly enlarged. There is no significant pericardial fluid, thickening or pericardial calcification. There is aortic atherosclerosis, as well as atherosclerosis of the great vessels of the mediastinum and the coronary arteries, including calcified atherosclerotic plaque in the left main, left anterior descending and right coronary arteries. Mediastinum/Nodes: Multiple prominent borderline enlarged mediastinal and hilar lymph nodes are noted, nonspecific. Esophagus is unremarkable in appearance. No axillary lymphadenopathy. Lungs/Pleura: Moderate right and small left pleural effusions. Some associated passive atelectasis in the right lung base. No acute consolidative airspace disease. No pleural effusions. Subpleural nodule measuring 7 x 5 mm (mean diameter of 6 mm) in the right middle lobe associated with the minor fissure, similar to the prior examination, considered benign (presumably a subpleural lymph node). No other definite suspicious appearing pulmonary nodules or masses are noted. Mild diffuse interlobular septal thickening, favored to reflect mild interstitial pulmonary edema. Diffuse bronchial wall thickening with mild centrilobular and paraseptal emphysema. Upper Abdomen: Aortic atherosclerosis. Musculoskeletal: There are no aggressive appearing lytic or blastic lesions noted in the visualized portions of the skeleton. IMPRESSION: 1. Moderate right and small left pleural effusions with evidence of very mild interstitial pulmonary edema in the lungs. Given the presence of mild cardiomegaly, findings are favored to reflect congestive heart failure. 2. Aortic atherosclerosis, in addition to left main and 2 vessel coronary artery disease. Assessment for potential risk factor modification, dietary therapy or pharmacologic therapy may be warranted, if clinically indicated. 3. Mild diffuse bronchial thickening with mild centrilobular and paraseptal emphysema;  imaging findings suggestive of underlying COPD. Aortic Atherosclerosis (ICD10-I70.0) and Emphysema (ICD10-J43.9). Electronically Signed   By: Trudie Reed M.D.   On: 02/14/2018 21:44    Wt Readings  from Last 3 Encounters:  02/15/18 73.7 kg  01/11/18 77.9 kg  09/21/17 76.1 kg    EKG: nsr with no ischemia  Physical Exam:  Blood pressure (!) 167/54, pulse 83, temperature 98.2 F (36.8 C), temperature source Oral, resp. rate 18, height 5\' 6"  (1.676 m), weight 73.7 kg, SpO2 97 %. Body mass index is 26.23 kg/m. General: Well developed, well nourished, in no acute distress. Head: Normocephalic, atraumatic, sclera non-icteric, no xanthomas, nares are without discharge.  Neck: Negative for carotid bruits. JVD not elevated. Lungs: Clear bilaterally to auscultation without wheezes, rales, or rhonchi. Breathing is unlabored. Heart: RRR with S1 S2. No murmurs, rubs, or gallops appreciated. Abdomen: Soft, non-tender, non-distended with normoactive bowel sounds. No hepatomegaly. No rebound/guarding. No obvious abdominal masses. Msk:  Strength and tone appear normal for age. Extremities: No clubbing or cyanosis. No edema.  Distal pedal pulses are 2+ and equal bilaterally. Neuro: Alert and oriented X 3. No facial asymmetry. No focal deficit. Moves all extremities spontaneously. Psych:  Responds to questions appropriately with a normal affect.     Assessment and Plan  Patient with history of hypertension, hyperlipidemia and CVA in the past admitted with an episode of dizziness.  This is improved.  He also complained of shortness of breath.  Chest x-ray revealed small pleural effusion with chest CT revealing bilateral pleural effusions.  He was diuresed and is improving.  Echocardiogram done recently revealed preserved LV function.  We will continue to carefully diurese.  Would ambulate and follow for further neurologic symptoms.  Would recheck BNP tomorrow to see if there is clinical improvement.   Echo did not show any evidence of systolic heart failure only diastolic heart failure.  Signed, Dalia HeadingKenneth A Auryn Paige MD 02/15/2018, 12:34 PM Pager: [phone]

## 2018-02-15 NOTE — Care Management Note (Addendum)
Case Management Note  Patient Details  Name: Thayne Clyde Jensen MRN: [id number] Date of BiJetta Loutrth: [birth year]  Subjective/Objective:  Patient is from home; lives with wife.  Admitted with SOB, bilateral pleural effusion.  Currently on room air.  Receiving IV lasix.  No current services in the home.  Ambulates at home without a walker or cane.   Denies difficulties obtaining medications or with medical care.  Current with his PCP, Dr. Hurley CiscoLand.  Physical therapy has recommended home health PT.  RNCM spoke with patient and he is agreeable to home health PT.  After offering choice, referral was made to [facility] home health and was accepted.  Will notify [facility] when patient discharges.                 Action/Plan:   Expected Discharge Date:                  Expected Discharge Plan:  Home w Home Health Services  In-House Referral:     Discharge planning Services  CM Consult  Post Acute Care Choice:    Choice offered to:  Patient  DME Arranged:    DME Agency:     HH Arranged:  [facility]  Status of Service:  Completed, signed off  If discussed at Long Length of Stay Meetings, dates discussed:    Additional Comments:  Sherren KernsJennifer L Takai Chiaramonte, RN 02/15/2018, 10:26 AM

## 2018-02-15 NOTE — Progress Notes (Signed)
[facility] [place] [facility]   PATIENT NAME: Maurice Sullivan    MR#:  [id number]  DATE OF BIRTH:  [birth year]  SUBJECTIVE:  Doing well this morning. No chest pain or shortness of breath. Feeling much better than when he first came in.   REVIEW OF SYSTEMS:  Review of Systems  Constitutional: Negative for chills and fever.  HENT: Negative for congestion and sore throat.   Eyes: Negative for blurred vision and double vision.  Respiratory: Negative for cough and shortness of breath.   Cardiovascular: Negative for chest pain and leg swelling.  Gastrointestinal: Negative for abdominal pain, nausea and vomiting.  Genitourinary: Negative for dysuria and frequency.  Musculoskeletal: Negative for back pain and myalgias.  Neurological: Negative for dizziness and headaches.  Psychiatric/Behavioral: Negative for depression. The patient is not nervous/anxious.     DRUG ALLERGIES:  No Known Allergies VITALS:  Blood pressure (!) 167/54, pulse 83, temperature 98.2 F (36.8 C), temperature source Oral, resp. rate 18, height 5\' 6"  (1.676 m), weight 73.7 kg, SpO2 97 %. PHYSICAL EXAMINATION:  Physical Exam  GENERAL:  72 y.o.-year-old patient lying in the bed with no acute distress.  EYES: Pupils equal, round, reactive to light and accommodation. No scleral icterus. Extraocular muscles intact.  HEENT: Head atraumatic, normocephalic. Oropharynx and nasopharynx clear.  NECK:  Supple, no jugular venous distention. No thyroid enlargement, no tenderness.  LUNGS: CTAB, no rhonchi or crepitation. No use of accessory muscles of respiration.  CARDIOVASCULAR: RRR, S1, S2 normal. No murmurs, rubs, or gallops.  ABDOMEN: Soft, nontender, nondistended. Bowel sounds present. No organomegaly or mass.  EXTREMITIES: No pedal edema, no cyanosis, or clubbing.  NEUROLOGIC: Cranial nerves II through XII are intact. Muscle strength 5/5 in all extremities. Sensation intact. Gait not checked.    PSYCHIATRIC: The patient is alert and oriented x 3.  SKIN: No obvious rash, lesion, or ulcer.  LABORATORY PANEL:  Male CBC Recent Labs  Lab 02/15/18 0412  WBC 7.8  HGB 11.3*  HCT 34.1*  PLT 288   ------------------------------------------------------------------------------------------------------------------ Chemistries  Recent Labs  Lab 02/14/18 1929  02/14/18 2317 02/15/18 0412  NA 142   < >  --  143  K 3.7   < >  --  3.2*  CL 114*   < >  --  112*  CO2 19*  --   --  22  GLUCOSE 105*   < >  --  102*  BUN 21   < >  --  20  CREATININE 1.79*   < >  --  1.74*  CALCIUM 8.1*  --   --  8.4*  MG  --   --  2.1  --   AST 20  --   --   --   ALT 12  --   --   --   ALKPHOS 98  --   --   --   BILITOT 0.5  --   --   --    < > = values in this interval not displayed.   RADIOLOGY:  Dg Chest 2 View  Result Date: 02/14/2018 CLINICAL DATA:  Chest pain for 3 days, dyspnea. EXAM: CHEST - 2 VIEW COMPARISON:  Chest radiograph Jul 24, 2017 FINDINGS: RIGHT lung base consolidation with small RIGHT pleural effusion. Mild bronchitic changes. Mild cardiomegaly. Mediastinal silhouette is not suspicious. No pneumothorax. Moderate degenerative changes spine. IMPRESSION: RIGHT lung base consolidation with small RIGHT pleural effusion concerning for bronchopneumonia. Followup PA and lateral  chest X-ray is recommended in 3-4 weeks following trial of antibiotic therapy to ensure resolution and exclude underlying malignancy. Stable mild cardiomegaly. Electronically Signed   By: Awilda Metro M.D.   On: 02/14/2018 20:14   Ct Chest Wo Contrast  Result Date: 02/14/2018 CLINICAL DATA:  72 year old male with history of chest pain for the past 3 days. Dyspnea. EXAM: CT CHEST WITHOUT CONTRAST TECHNIQUE: Multidetector CT imaging of the chest was performed following the standard protocol without IV contrast. COMPARISON:  Chest CT 10/27/2010. FINDINGS: Cardiovascular: Heart size is mildly enlarged. There is no  significant pericardial fluid, thickening or pericardial calcification. There is aortic atherosclerosis, as well as atherosclerosis of the great vessels of the mediastinum and the coronary arteries, including calcified atherosclerotic plaque in the left main, left anterior descending and right coronary arteries. Mediastinum/Nodes: Multiple prominent borderline enlarged mediastinal and hilar lymph nodes are noted, nonspecific. Esophagus is unremarkable in appearance. No axillary lymphadenopathy. Lungs/Pleura: Moderate right and small left pleural effusions. Some associated passive atelectasis in the right lung base. No acute consolidative airspace disease. No pleural effusions. Subpleural nodule measuring 7 x 5 mm (mean diameter of 6 mm) in the right middle lobe associated with the minor fissure, similar to the prior examination, considered benign (presumably a subpleural lymph node). No other definite suspicious appearing pulmonary nodules or masses are noted. Mild diffuse interlobular septal thickening, favored to reflect mild interstitial pulmonary edema. Diffuse bronchial wall thickening with mild centrilobular and paraseptal emphysema. Upper Abdomen: Aortic atherosclerosis. Musculoskeletal: There are no aggressive appearing lytic or blastic lesions noted in the visualized portions of the skeleton. IMPRESSION: 1. Moderate right and small left pleural effusions with evidence of very mild interstitial pulmonary edema in the lungs. Given the presence of mild cardiomegaly, findings are favored to reflect congestive heart failure. 2. Aortic atherosclerosis, in addition to left main and 2 vessel coronary artery disease. Assessment for potential risk factor modification, dietary therapy or pharmacologic therapy may be warranted, if clinically indicated. 3. Mild diffuse bronchial thickening with mild centrilobular and paraseptal emphysema; imaging findings suggestive of underlying COPD. Aortic Atherosclerosis  (ICD10-I70.0) and Emphysema (ICD10-J43.9). Electronically Signed   By: Trudie Reed M.D.   On: 02/14/2018 21:44   ASSESSMENT AND PLAN:   Acute diastolic CHF- improving, on room air. Recent ECHO with EF 50-55%. -Continue IV lasix -Cardiology following -Continue aspirin, plavix, coreg, lisinopril  Hypertension- BPs improved. -Continue norvasc, coreg, doxazosin, lisinopril -Labetalol prn  Chest pain with elevated troponin - likely due to demand ischemia.  -Continue aspirin, plavix and lipitor -Cardiology following  Hypokalemia- K 3.2 -Replete  CKD stage III- Cr at baseline -Monitor  Tobacco abuse -Smoking cessation counseling this admission -Nicotine patch  All the records are reviewed and case discussed with Care Management/Social Worker. Management plans discussed with the patient, family and they are in agreement.  CODE STATUS: Full Code  TOTAL TIME TAKING CARE OF THIS PATIENT: 35 minutes.   More than 50% of the time was spent in counseling/coordination of care: YES  POSSIBLE D/C tomorrow, DEPENDING ON CLINICAL CONDITION.   Jinny Blossom Aneisha Skyles M.D on 02/15/2018 at 2:08 PM  Between 7am to 6pm - Pager [phone]  After 6pm go to [email or web address] - Social research officer, government  [facility]  Office  [phone]  CC: Primary care physician; Treasa School, PA-C  Note: This dictation was prepared with Dragon dictation along with smaller phrase technology. Any transcriptional errors that result from this process are unintentional.

## 2018-02-15 NOTE — Evaluation (Signed)
Physical Therapy Evaluation Patient Details Name: Maurice Sullivan MRN: [id number] DOB: 11-Sep-1945 Today's Date: 02/15/2018   History of Present Illness  Pt is a 72 y/o M who presented with uncontrolled HTN for the previous 1 month, chest discomfort, SOB, cough, wheezing.  Imaging showed R and L pleural effusions with pulmonary edema but no PE.  Pt's PMH includes DVA, glaucoma.      Clinical Impression  Pt admitted with above diagnosis. Pt currently with functional limitations due to the deficits listed below (see PT Problem List). Maurice Sullivan was independent at baseline but reports "I always feel unsteady". Pt independent with bed mobility and min guard assist provided for sit>stand and ambulation for safety as pt demonstrates guarded posture.  Pt demonstrates mild balance deficits with balance testing and thus recommending HHPT at d/c. Pt will benefit from skilled PT to increase their independence and safety with mobility to allow discharge to the venue listed below.      Follow Up Recommendations Home health PT    Equipment Recommendations  None recommended by PT    Recommendations for Other Services       Precautions / Restrictions Precautions Precautions: Fall Restrictions Weight Bearing Restrictions: No      Mobility  Bed Mobility Overal bed mobility: Independent             General bed mobility comments: No physical assist or cues needed.  Pt performs independently.   Transfers Overall transfer level: Needs assistance Equipment used: None Transfers: Sit to/from Stand Sit to Stand: Min guard         General transfer comment: Mild instability noted but no LOB and no physical assist or cues needed.   Ambulation/Gait Ambulation/Gait assistance: Min guard Gait Distance (Feet): 100 Feet Assistive device: None Gait Pattern/deviations: Step-through pattern;Decreased step length - right;Decreased step length - left     General Gait Details: Pt with guarded  posture (pt reports he always feels unsteady).  No signs of instability and no LOB.  Min gaurd provided for safety.    Stairs            Wheelchair Mobility    Modified Rankin (Stroke Patients Only)       Balance Overall balance assessment: Needs assistance Sitting-balance support: No upper extremity supported;Feet supported Sitting balance-Leahy Scale: Good     Standing balance support: No upper extremity supported;During functional activity Standing balance-Leahy Scale: Fair Standing balance comment: Pt able to stand and ambulate without AD but demonstrates instability with challenges to balance Single Leg Stance - Right Leg: 8 Single Leg Stance - Left Leg: 0 Tandem Stance - Right Leg: 30 Tandem Stance - Left Leg: 30     High level balance activites: Head turns;Other (comment) High Level Balance Comments: Tandem stance with eyes closed which pt performed well without LOB for ~20 seconds             Pertinent Vitals/Pain Pain Assessment: No/denies pain    Home Living Family/patient expects to be discharged to:: Private residence Living Arrangements: Spouse/significant other Available Help at Discharge: Family;Available 24 hours/day(wife able to provide limited assist; 3 children live nearby) Type of Home: Mobile home Home Access: Stairs to enter Entrance Stairs-Rails: Left;Right;Can reach both Entrance Stairs-Number of Steps: 3 Home Layout: One level Home Equipment: Shower seat;Hand held shower head;Bedside commode;Walker - 4 wheels;Wheelchair - manual;Cane - single point(Wife uses BSC)      Prior Function Level of Independence: Independent         Comments:  Pt ambulating without AD in home and in community but does use cane outdoors at home.  No recent falls.  Wife does the driving, wife doesn't want pt driving.  Pt ind with bathing, dressing.      Hand Dominance        Extremity/Trunk Assessment   Upper Extremity Assessment Upper Extremity  Assessment: Overall WFL for tasks assessed    Lower Extremity Assessment Lower Extremity Assessment: Overall WFL for tasks assessed       Communication   Communication: No difficulties  Cognition Arousal/Alertness: Awake/alert Behavior During Therapy: WFL for tasks assessed/performed Overall Cognitive Status: Within Functional Limits for tasks assessed                                        General Comments General comments (skin integrity, edema, etc.): BP monitored during session.  Supine: 152/57, Sitting: 158/56, Standing at bedside: 141/55.  Sitting in chair at end of session: 156/71    Exercises     Assessment/Plan    PT Assessment Patient needs continued PT services  PT Problem List Decreased balance       PT Treatment Interventions DME instruction;Gait training;Stair training;Functional mobility training;Therapeutic activities;Therapeutic exercise;Balance training;Neuromuscular re-education;Patient/family education    PT Goals (Current goals can be found in the Care Plan section)  Acute Rehab PT Goals Patient Stated Goal: to return home PT Goal Formulation: With patient Time For Goal Achievement: 03/01/18 Potential to Achieve Goals: Good    Frequency Min 2X/week   Barriers to discharge        Co-evaluation               AM-PAC PT "6 Clicks" Mobility  Outcome Measure Help needed turning from your back to your side while in a flat bed without using bedrails?: None Help needed moving from lying on your back to sitting on the side of a flat bed without using bedrails?: None Help needed moving to and from a bed to a chair (including a wheelchair)?: A Little Help needed standing up from a chair using your arms (e.g., wheelchair or bedside chair)?: A Little Help needed to walk in hospital room?: A Little Help needed climbing 3-5 steps with a railing? : A Little 6 Click Score: 20    End of Session Equipment Utilized During Treatment: Gait  belt Activity Tolerance: Patient tolerated treatment well Patient left: in chair;with call bell/phone within reach;with chair alarm set Nurse Communication: Mobility status PT Visit Diagnosis: Unsteadiness on feet (R26.81);Other abnormalities of gait and mobility (R26.89)    Time: 1610-9604 PT Time Calculation (min) (ACUTE ONLY): 32 min   Charges:   PT Evaluation $PT Eval Low Complexity: 1 Low PT Treatments $Gait Training: 8-22 mins        Session was performed by student PT, Lisbeth Renshaw, and directed, overseen, and documented by this PT.  Encarnacion Chu PT, DPT 02/15/2018, 10:45 AM

## 2018-02-15 NOTE — Plan of Care (Signed)
  Problem: Activity: Goal: Risk for activity intolerance will decrease Outcome: Progressing   Problem: Coping: Goal: Level of anxiety will decrease Outcome: Progressing   Problem: Elimination: Goal: Will not experience complications related to bowel motility Outcome: Progressing Goal: Will not experience complications related to urinary retention Outcome: Progressing   Problem: Skin Integrity: Goal: Risk for impaired skin integrity will decrease Outcome: Progressing   Problem: Activity: Goal: Capacity to carry out activities will improve Outcome: Progressing   Problem: Cardiac: Goal: Ability to achieve and maintain adequate cardiopulmonary perfusion will improve Outcome: Progressing

## 2018-02-16 LAB — CBC
HEMATOCRIT: 33.3 % — AB (ref 39.0–52.0)
HEMOGLOBIN: 10.9 g/dL — AB (ref 13.0–17.0)
MCH: 29.4 pg (ref 26.0–34.0)
MCHC: 32.7 g/dL (ref 30.0–36.0)
MCV: 89.8 fL (ref 80.0–100.0)
Platelets: 275 10*3/uL (ref 150–400)
RBC: 3.71 MIL/uL — AB (ref 4.22–5.81)
RDW: 15.6 % — ABNORMAL HIGH (ref 11.5–15.5)
WBC: 6.9 10*3/uL (ref 4.0–10.5)
nRBC: 0 % (ref 0.0–0.2)

## 2018-02-16 LAB — BASIC METABOLIC PANEL
Anion gap: 4 — ABNORMAL LOW (ref 5–15)
BUN: 28 mg/dL — AB (ref 8–23)
CHLORIDE: 114 mmol/L — AB (ref 98–111)
CO2: 25 mmol/L (ref 22–32)
Calcium: 8.4 mg/dL — ABNORMAL LOW (ref 8.9–10.3)
Creatinine, Ser: 2.11 mg/dL — ABNORMAL HIGH (ref 0.61–1.24)
GFR calc Af Amer: 34 mL/min — ABNORMAL LOW (ref >60)
GFR calc non Af Amer: 29 mL/min — ABNORMAL LOW (ref >60)
GLUCOSE: 113 mg/dL — AB (ref 70–99)
Potassium: 3.9 mmol/L (ref 3.5–5.1)
Sodium: 143 mmol/L (ref 135–145)

## 2018-02-16 LAB — BRAIN NATRIURETIC PEPTIDE: B Natriuretic Peptide: 231 pg/mL — ABNORMAL HIGH (ref 0.0–100.0)

## 2018-02-16 MED ORDER — CARVEDILOL 6.25 MG PO TABS: 6.2500 mg | ORAL_TABLET | Freq: Two times a day (BID) | ORAL | 0 refills | Status: AC

## 2018-02-16 MED ORDER — ASPIRIN 325 MG PO TBEC: 325.0000 mg | DELAYED_RELEASE_TABLET | Freq: Every day | ORAL | 0 refills | Status: DC

## 2018-02-16 MED ORDER — FUROSEMIDE 40 MG PO TABS: 40.0000 mg | ORAL_TABLET | Freq: Every day | ORAL | 0 refills | Status: DC

## 2018-02-16 MED ORDER — FUROSEMIDE 40 MG PO TABS: 40.0000 mg | ORAL_TABLET | Freq: Every day | ORAL | 0 refills | Status: AC

## 2018-02-16 NOTE — Discharge Instructions (Signed)
It was so nice to meet you during this hospitalization! You came into the hospital because you were having shortness of breath and swelling in your legs. We found that this was caused by congestive heart failure.   We have started you on the following medications: 1. Please take Coreg 6.25mg  twice a day 2. Please take Lasix 40mg  daily starting 02/18/18 3. Please HOLD your Lisinopril for today and tomorrow and restart on 02/18/18  Please make sure you follow-up with the PCP and the cardiologist (Dr. Lady GaryFath) in the next week.  -Dr. Nancy MarusMayo

## 2018-02-16 NOTE — Discharge Summary (Addendum)
[facility] - [place] at [facility]   PATIENT NAME: Maurice Sullivan    MR#:  [id number]  DATE OF BIRTH:  Jan 09, 1946  DATE OF ADMISSION:  02/14/2018   ADMITTING PHYSICIAN: Maurice Pollack, MD  DATE OF DISCHARGE: 02/16/18  PRIMARY CARE PHYSICIAN: Maurice School, PA-C   ADMISSION DIAGNOSIS:  Shortness of breath [R06.02] Pleural effusion [J90] Hypertensive urgency [I16.0] Elevated troponin I level [R79.89] DISCHARGE DIAGNOSIS:  Active Problems:   HTN (hypertension), malignant  SECONDARY DIAGNOSIS:   Past Medical History:  Diagnosis Date  . Anxiety   . Arthritis   . CVA (cerebral infarction)   . Depression   . Glaucoma   . Gout   . Heart disease   . Hyperlipemia   . Hypertension    HOSPITAL COURSE:   Maurice Sullivan is a 72 year old male presented to the ED with shortness of breath and orthopnea.  In the ED, he was found to be hypoxic and hypertension with BP 208/67.  Chest x-ray showed right-sided pleural effusion and CTA chest showed moderate right and small left pleural effusions with evidence of pulmonary edema.  He was admitted for further management.  Acute diastolic CHF- improving, on room air.Recent ECHO with EF 50-55%. -Initially treated with IV lasix, then transitioned to Lasix 40 mg po daily -Seen by cardiology this admission and patient will follow-up with them as an outpatient -Started on Coreg this admission  Hypertension- BP initially markedly elevated, but improved during hospitalization -Continued norvasc, coreg, doxazosin, lisinopril  Elevated creatinine in CKD III- creatinine increased from 1.74 to 2.11 on the day of discharge.  Likely due to IV diuresis and lisinopril. -Patient advised to hold Lasix and lisinopril for 2 days and then restart -Needs creatinine rechecked as an outpatient  Elevated troponin - likely due to demand ischemia.  No active chest pain. -Continued plavix and lipitor -Follow-up with cardiology as an  outpatient  Tobacco abuse -Smoking cessation counseling this admission -Nicotine patch  DISCHARGE CONDITIONS:  Acute diastolic CHF Hypertension Elevated creatinine in CKD 3 Tobacco abuse CONSULTS OBTAINED:  Treatment Team:  Dalia Heading, MD DRUG ALLERGIES:  No Known Allergies DISCHARGE MEDICATIONS:   Allergies as of 02/16/2018   No Known Allergies     Medication List    TAKE these medications   allopurinol 300 MG tablet Commonly known as:  ZYLOPRIM Take 300 mg by mouth daily.   amLODipine 10 MG tablet Commonly known as:  NORVASC Take 10 mg by mouth daily.   atorvastatin 40 MG tablet Commonly known as:  LIPITOR Take 40 mg by mouth daily.   carvedilol 6.25 MG tablet Commonly known as:  COREG Take 1 tablet (6.25 mg total) by mouth 2 (two) times daily with a meal.   clopidogrel 75 MG tablet Commonly known as:  PLAVIX Take 1 tablet (75 mg total) by mouth daily.   dorzolamide-timolol 22.3-6.8 MG/ML ophthalmic solution Commonly known as:  COSOPT Place 1 drop into the left eye 2 (two) times daily.   doxazosin 2 MG tablet Commonly known as:  CARDURA Take 2 mg by mouth daily.   finasteride 5 MG tablet Commonly known as:  PROSCAR Take 5 mg by mouth daily.   furosemide 40 MG tablet Commonly known as:  LASIX Take 1 tablet (40 mg total) by mouth daily.   lisinopril 10 MG tablet Commonly known as:  PRINIVIL,ZESTRIL Take 10 mg by mouth daily.   sildenafil 100 MG tablet Commonly known as:  VIAGRA Take 0.5 tablets (50 mg total)  by mouth daily as needed for erectile dysfunction (Take 1-5 tablets as needed 1 hour prior to sexual activity).   tamsulosin 0.4 MG Caps capsule Commonly known as:  FLOMAX Take 1 capsule by mouth daily.        DISCHARGE INSTRUCTIONS:  1.  Follow-up with PCP in 1 to 2 weeks 2.  Follow-up with cardiology within 1 week 3.  Patient had a bump in creatinine on the day of discharge.  Advised to hold Lasix and lisinopril for 2 days  and then restart.  Needs creatinine rechecked as an outpatient 4.  Started on Lasix 40 mg po daily this admission.  Follow-up volume status and adjust as needed. DIET:  Cardiac diet DISCHARGE CONDITION:  Stable ACTIVITY:  Activity as tolerated OXYGEN:  Home Oxygen: No.  Oxygen Delivery: room air DISCHARGE LOCATION:  home   If you experience worsening of your admission symptoms, develop shortness of breath, life threatening emergency, suicidal or homicidal thoughts you must seek medical attention immediately by calling 911 or calling your MD immediately  if symptoms less severe.  You Must read complete instructions/literature along with all the possible adverse reactions/side effects for all the Medicines you take and that have been prescribed to you. Take any new Medicines after you have completely understood and accpet all the possible adverse reactions/side effects.   Please note  You were cared for by a hospitalist during your hospital stay. If you have any questions about your discharge medications or the care you received while you were in the hospital after you are discharged, you can call the unit and asked to speak with the hospitalist on call if the hospitalist that took care of you is not available. Once you are discharged, your primary care physician will handle any further medical issues. Please note that NO REFILLS for any discharge medications will be authorized once you are discharged, as it is imperative that you return to your primary care physician (or establish a relationship with a primary care physician if you do not have one) for your aftercare needs so that they can reassess your need for medications and monitor your lab values.    On the day of Discharge:  VITAL SIGNS:  Blood pressure (!) 167/47, pulse 75, temperature 98 F (36.7 C), temperature source Oral, resp. rate 15, height 5\' 6"  (1.676 m), weight 71.3 kg, SpO2 100 %. PHYSICAL EXAMINATION:  GENERAL:  72  y.o.-year-old patient lying in the bed with no acute distress.  EYES: Pupils equal, round, reactive to light and accommodation. No scleral icterus. Extraocular muscles intact.  HEENT: Head atraumatic, normocephalic. Oropharynx and nasopharynx clear.  NECK:  Supple, no jugular venous distention. No thyroid enlargement, no tenderness.  LUNGS: Normal breath sounds bilaterally, no wheezing, rales,rhonchi or crepitation. No use of accessory muscles of respiration.  CARDIOVASCULAR: RRR, S1, S2 normal. No murmurs, rubs, or gallops.  ABDOMEN: Soft, non-tender, non-distended. Bowel sounds present. No organomegaly or mass.  EXTREMITIES: No pedal edema, cyanosis, or clubbing.  NEUROLOGIC: Cranial nerves II through XII are intact. +global weakness. Sensation intact. Gait not checked.  PSYCHIATRIC: The patient is alert and oriented x 3.  SKIN: No obvious rash, lesion, or ulcer.  DATA REVIEW:   CBC Recent Labs  Lab 02/16/18 0349  WBC 6.9  HGB 10.9*  HCT 33.3*  PLT 275    Chemistries  Recent Labs  Lab 02/14/18 1929  02/14/18 2317  02/16/18 0349  NA 142   < >  --    < >  143  K 3.7   < >  --    < > 3.9  CL 114*   < >  --    < > 114*  CO2 19*  --   --    < > 25  GLUCOSE 105*   < >  --    < > 113*  BUN 21   < >  --    < > 28*  CREATININE 1.79*   < >  --    < > 2.11*  CALCIUM 8.1*  --   --    < > 8.4*  MG  --   --  2.1  --   --   AST 20  --   --   --   --   ALT 12  --   --   --   --   ALKPHOS 98  --   --   --   --   BILITOT 0.5  --   --   --   --    < > = values in this interval not displayed.     Microbiology Results  Results for orders placed or performed in visit on 08/24/15  Microscopic Examination     Status: None   Collection Time: 08/24/15  2:19 PM  Result Value Ref Range Status   WBC, UA 0-5 0 - 5 /hpf Final   RBC, UA 0-2 0 - 2 /hpf Final   Epithelial Cells (non renal) 0-10 0 - 10 /hpf Final   Bacteria, UA None seen None seen/Few Final    RADIOLOGY:  No results  found.   Management plans discussed with the patient, family and they are in agreement.  CODE STATUS: Full Code   TOTAL TIME TAKING CARE OF THIS PATIENT: 35 minutes.    Jinny Blossom Alistar Mcenery M.D on 02/16/2018 at 9:44 AM  Between 7am to 6pm - Pager - [phone]  After 6pm go to [email or web address] - Social research officer, government  [facility] [place] [facility]  Office  [phone]  CC: Primary care physician; Maurice School, PA-C   Note: This dictation was prepared with Dragon dictation along with smaller phrase technology. Any transcriptional errors that result from this process are unintentional.

## 2018-02-16 NOTE — Care Management (Signed)
Contacted [facility] regarding discharge and order present for PT and RN

## 2018-02-16 NOTE — Plan of Care (Signed)
Nutrition Education Note  RD consulted for nutrition education regarding low sodium education  72 y/o male with history of hypertension, hyperlipidemia and CVA in the past admitted with an episode of dizziness.   RD provided "Low Sodium Nutrition Therapy" handout from the Academy of [employer]etics. Reviewed patient's dietary recall. Provided examples on ways to decrease sodium intake in diet. Discouraged intake of processed foods and use of salt shaker. Encouraged fresh fruits and vegetables as well as whole grain sources of carbohydrates to maximize fiber intake.   RD discussed why it is important for patient to adhere to diet recommendations, and emphasized the role of fluids, foods to avoid, and importance of weighing self daily. Teach back method used.  Expect fair compliance.  Body mass index is 25.37 kg/m. Pt meets criteria for normal weight for age based on current BMI.  Current diet order is HH, patient is consuming approximately 100% of meals at this time. Labs and medications reviewed. No further nutrition interventions warranted at this time. RD contact information provided. If additional nutrition issues arise, please re-consult RD.   Betsey Holidayasey Mesha Schamberger MS, RD, LDN Pager #- [phone] Office#- [phone] After Hours Pager: [phone]

## 2018-02-16 NOTE — Progress Notes (Signed)
Maurice Sullivan  SUBJECTIVE: Maurice Sullivan is a 72 y.o. male with history of hypertension, hyperlipidemia, and history of CVA.  He was admitted on 02/14/18 with complaints of dizziness, elevated blood pressure, and orthopena. Consultation called for heart failure.  Today, Ms. Maurice Sullivan reports that he is doing well. Sitting up in bed eating breakfast in no acute distress.  Denies chest pain, palpitations, or shortness of breath.  Denies lower extremity swelling.  Denies dizziness or lightheadedness. Has improved since diuresis. Feels at his baseline.   Vitals:   02/15/18 2003 02/15/18 2006 02/16/18 0357 02/16/18 0807  BP: (!) 141/44 (!) 151/49 (!) 145/59 (!) 167/47  Pulse: 79 75 75   Resp: 16  18 15   Temp: 98.9 F (37.2 C)  98.5 F (36.9 C) 98 F (36.7 C)  TempSrc: Oral  Oral Oral  SpO2: 94%  96% 100%  Weight:   71.3 kg   Height:         Intake/Output Summary (Last 24 hours) at 02/16/2018 1123 Last data filed at 02/16/2018 [id number] Gross per 24 hour  Intake 3 ml  Output 825 ml  Net -822 ml      PHYSICAL EXAM  General: Well developed, well nourished. Sitting up in bed, eating breakfast in no acute distress HEENT:  Normocephalic and atramatic Neck:  No JVD.  Lungs: Clear bilaterally to auscultation and percussion. Heart: HRRR . Normal S1 and S2 without gallops or murmurs.  Abdomen: Bowel sounds are positive, abdomen soft and non-tender  Msk:  Back normal. Normal strength and tone for age. Gait not assessed  Extremities: No clubbing, cyanosis or edema.   Neuro: Alert and oriented X 3. Psych:  Good affect, responds appropriately   LABS: Basic Metabolic Panel: Recent Labs    02/14/18 2317 02/15/18 0412 02/16/18 0349  NA  --  143 143  K  --  3.2* 3.9  CL  --  112* 114*  CO2  --  22 25  GLUCOSE  --  102* 113*  BUN  --  20 28*  CREATININE  --  1.74* 2.11*  CALCIUM  --  8.4* 8.4*  MG 2.1  --   --    Liver Function Tests: Recent Labs    02/14/18 1929  AST 20   ALT 12  ALKPHOS 98  BILITOT 0.5  PROT 6.2*  ALBUMIN 2.5*   No results for input(s): LIPASE, AMYLASE in the last 72 hours. CBC: Recent Labs    02/14/18 1929  02/15/18 0412 02/16/18 0349  WBC 9.6  --  7.8 6.9  NEUTROABS 6.8  --   --   --   HGB 11.3*   < > 11.3* 10.9*  HCT 34.4*   < > 34.1* 33.3*  MCV 91.2  --  90.5 89.8  PLT 316  --  288 275   < > = values in this interval not displayed.   Cardiac Enzymes: Recent Labs    02/14/18 1929 02/14/18 2317 02/15/18 1224  TROPONINI 0.08* 0.08* 0.13*   BNP: Invalid input(s): POCBNP D-Dimer: No results for input(s): DDIMER in the last 72 hours. Hemoglobin A1C: No results for input(s): HGBA1C in the last 72 hours. Fasting Lipid Panel: No results for input(s): CHOL, HDL, LDLCALC, TRIG, CHOLHDL, LDLDIRECT in the last 72 hours. Thyroid Function Tests: No results for input(s): TSH, T4TOTAL, T3FREE, THYROIDAB in the last 72 hours.  Invalid input(s): FREET3 Anemia Panel: No results for input(s): VITAMINB12, FOLATE, FERRITIN, TIBC, IRON, RETICCTPCT in the last 72  hours.  Dg Chest 2 View  Result Date: 02/14/2018 CLINICAL DATA:  Chest pain for 3 days, dyspnea. EXAM: CHEST - 2 VIEW COMPARISON:  Chest radiograph Jul 24, 2017 FINDINGS: RIGHT lung base consolidation with small RIGHT pleural effusion. Mild bronchitic changes. Mild cardiomegaly. Mediastinal silhouette is not suspicious. No pneumothorax. Moderate degenerative changes spine. IMPRESSION: RIGHT lung base consolidation with small RIGHT pleural effusion concerning for bronchopneumonia. Followup PA and lateral chest X-ray is recommended in 3-4 weeks following trial of antibiotic therapy to ensure resolution and exclude underlying malignancy. Stable mild cardiomegaly. Electronically Signed   By: Awilda Metro M.D.   On: 02/14/2018 20:14   Ct Chest Wo Contrast  Result Date: 02/14/2018 CLINICAL DATA:  72 year old male with history of chest pain for the past 3 days. Dyspnea. EXAM:  CT CHEST WITHOUT CONTRAST TECHNIQUE: Multidetector CT imaging of the chest was performed following the standard protocol without IV contrast. COMPARISON:  Chest CT 10/27/2010. FINDINGS: Cardiovascular: Heart size is mildly enlarged. There is no significant pericardial fluid, thickening or pericardial calcification. There is aortic atherosclerosis, as well as atherosclerosis of the great vessels of the mediastinum and the coronary arteries, including calcified atherosclerotic plaque in the left main, left anterior descending and right coronary arteries. Mediastinum/Nodes: Multiple prominent borderline enlarged mediastinal and hilar lymph nodes are noted, nonspecific. Esophagus is unremarkable in appearance. No axillary lymphadenopathy. Lungs/Pleura: Moderate right and small left pleural effusions. Some associated passive atelectasis in the right lung base. No acute consolidative airspace disease. No pleural effusions. Subpleural nodule measuring 7 x 5 mm (mean diameter of 6 mm) in the right middle lobe associated with the minor fissure, similar to the prior examination, considered benign (presumably a subpleural lymph node). No other definite suspicious appearing pulmonary nodules or masses are noted. Mild diffuse interlobular septal thickening, favored to reflect mild interstitial pulmonary edema. Diffuse bronchial wall thickening with mild centrilobular and paraseptal emphysema. Upper Abdomen: Aortic atherosclerosis. Musculoskeletal: There are no aggressive appearing lytic or blastic lesions noted in the visualized portions of the skeleton. IMPRESSION: 1. Moderate right and small left pleural effusions with evidence of very mild interstitial pulmonary edema in the lungs. Given the presence of mild cardiomegaly, findings are favored to reflect congestive heart failure. 2. Aortic atherosclerosis, in addition to left main and 2 vessel coronary artery disease. Assessment for potential risk factor modification, dietary  therapy or pharmacologic therapy may be warranted, if clinically indicated. 3. Mild diffuse bronchial thickening with mild centrilobular and paraseptal emphysema; imaging findings suggestive of underlying COPD. Aortic Atherosclerosis (ICD10-I70.0) and Emphysema (ICD10-J43.9). Electronically Signed   By: Trudie Reed M.D.   On: 02/14/2018 21:44     Echo: Diastolic dysfunction with preserved EF   ASSESSMENT AND PLAN:  Active Problems:   HTN (hypertension), malignant    1.  Acute diastolic congestive heart failure   -Improved with IV Lasix, has diuresed 2.9L since admission   -BNP improved from 791 to 231   -Continue current medications and follow outpatient 2.  Elevated troponin  -Likely due to demand ischemia; okay to discharge from cardiac standpoint  3. Hypertension   -Continue home medication regimen  4.  Chronic kidney disease    -Continue to monitor outpatient    Andi Hence, PA-C 02/16/2018 11:23 AM

## 2018-02-16 NOTE — Plan of Care (Signed)
  Problem: Elimination: Goal: Will not experience complications related to bowel motility Outcome: Progressing Goal: Will not experience complications related to urinary retention Outcome: Progressing   Problem: Skin Integrity: Goal: Risk for impaired skin integrity will decrease Outcome: Progressing   Problem: Activity: Goal: Capacity to carry out activities will improve Outcome: Progressing   Problem: Cardiac: Goal: Ability to achieve and maintain adequate cardiopulmonary perfusion will improve Outcome: Progressing

## 2018-02-16 NOTE — Progress Notes (Signed)
Pt discharged to home via wc.  Instructions  given to pt.  Questions answered.  No distress.  

## 2018-02-19 ENCOUNTER — Telehealth: Payer: Self-pay

## 2018-02-19 NOTE — Telephone Encounter (Signed)
Flagged on EMMI report for having unfilled prescriptions and questions about discharge papers.  Called and spoke with patient's spouse as patient unavailable.  Per wife, they were able to fill all of his prescriptions and did not have any questions concerning his discharge papers at this time.

## 2018-02-22 ENCOUNTER — Telehealth: Payer: Self-pay | Admitting: Licensed Clinical Social Worker

## 2018-02-22 NOTE — Telephone Encounter (Signed)
CSW followed up with patient who stated he has lost interest in things.  Patient stated that everything is going well, and he feels good about how he is getting better.  Patient did not express any other needs, CSW signing off.  Ervin KnackEric R. Noora Locascio, MSW, Theresia MajorsLCSWA [phone]  02/22/2018 6:15 PM

## 2018-04-26 ENCOUNTER — Other Ambulatory Visit: Payer: Self-pay | Admitting: Internal Medicine

## 2018-05-16 DIAGNOSIS — I351 Nonrheumatic aortic (valve) insufficiency: Secondary | ICD-10-CM

## 2018-05-16 DIAGNOSIS — J9601 Acute respiratory failure with hypoxia: Secondary | ICD-10-CM | POA: Diagnosis not present

## 2018-05-16 DIAGNOSIS — I161 Hypertensive emergency: Secondary | ICD-10-CM

## 2018-05-16 DIAGNOSIS — I509 Heart failure, unspecified: Secondary | ICD-10-CM

## 2018-05-16 DIAGNOSIS — J441 Chronic obstructive pulmonary disease with (acute) exacerbation: Secondary | ICD-10-CM

## 2018-05-16 DIAGNOSIS — N289 Disorder of kidney and ureter, unspecified: Secondary | ICD-10-CM

## 2018-05-16 DIAGNOSIS — I639 Cerebral infarction, unspecified: Secondary | ICD-10-CM

## 2018-05-17 DIAGNOSIS — I161 Hypertensive emergency: Secondary | ICD-10-CM | POA: Diagnosis not present

## 2018-05-17 DIAGNOSIS — J441 Chronic obstructive pulmonary disease with (acute) exacerbation: Secondary | ICD-10-CM | POA: Diagnosis not present

## 2018-05-17 DIAGNOSIS — I509 Heart failure, unspecified: Secondary | ICD-10-CM | POA: Diagnosis not present

## 2018-05-17 DIAGNOSIS — J9601 Acute respiratory failure with hypoxia: Secondary | ICD-10-CM | POA: Diagnosis not present

## 2018-05-18 DIAGNOSIS — Z8673 Personal history of transient ischemic attack (TIA), and cerebral infarction without residual deficits: Secondary | ICD-10-CM

## 2018-05-18 DIAGNOSIS — I471 Supraventricular tachycardia: Secondary | ICD-10-CM

## 2018-05-18 DIAGNOSIS — I10 Essential (primary) hypertension: Secondary | ICD-10-CM

## 2018-05-18 DIAGNOSIS — E785 Hyperlipidemia, unspecified: Secondary | ICD-10-CM

## 2018-05-18 DIAGNOSIS — I5032 Chronic diastolic (congestive) heart failure: Secondary | ICD-10-CM

## 2018-05-18 DIAGNOSIS — I2510 Atherosclerotic heart disease of native coronary artery without angina pectoris: Secondary | ICD-10-CM

## 2018-05-18 DIAGNOSIS — J449 Chronic obstructive pulmonary disease, unspecified: Secondary | ICD-10-CM

## 2018-05-18 DIAGNOSIS — N183 Chronic kidney disease, stage 3 (moderate): Secondary | ICD-10-CM

## 2018-05-19 DIAGNOSIS — I471 Supraventricular tachycardia: Secondary | ICD-10-CM | POA: Diagnosis not present

## 2018-05-26 ENCOUNTER — Other Ambulatory Visit: Payer: Self-pay | Admitting: Internal Medicine

## 2018-07-07 ENCOUNTER — Telehealth: Payer: Self-pay

## 2018-07-07 NOTE — Telephone Encounter (Signed)
I called and spoke with patient and made him aware that Due to current COVID 19 pandemic, our office is severely reducing in office visits for at least the next 2 weeks, in order to minimize the risk to our patients and healthcare providers.   I explained and offered a virtual visit but he is not capable of doing this and requested that this just be done over the phone.

## 2018-07-13 ENCOUNTER — Other Ambulatory Visit: Payer: Self-pay

## 2018-07-13 ENCOUNTER — Ambulatory Visit (INDEPENDENT_AMBULATORY_CARE_PROVIDER_SITE_OTHER): Payer: Medicare Other | Admitting: Neurology

## 2018-07-13 ENCOUNTER — Encounter: Payer: Self-pay | Admitting: Neurology

## 2018-07-13 DIAGNOSIS — I639 Cerebral infarction, unspecified: Secondary | ICD-10-CM | POA: Diagnosis not present

## 2018-07-13 MED ORDER — CLOPIDOGREL BISULFATE 75 MG PO TABS: 75.0000 mg | ORAL_TABLET | Freq: Every day | ORAL | 4 refills | Status: AC

## 2018-07-13 NOTE — Progress Notes (Signed)
[facility]  PATIENT: Maurice Sullivan DOB: 04/01/1945   REASON FOR VISIT: Follow-up for CVA HISTORY FROM: Patient and son [name]    HISTORY OF PRESENT ILLNESS: 7/1/19YYAdolphus Johny ChessClyde Sullivan is a 73 years old male, seen in refer by his primary care PA land, Aneta Minshillip, for evaluation of cerebrovascular accident, initial evaluation was on September 21, 2017.  He is accompanied by his wife at today's visit.  He had a past medical history of hypertension, hyperlipidemia, had multiple stroke in the past, most recent one was in May 2019, was [facility], previous stroke was [place] in [place] in [place],  In May 2019, while he was at his cousin's funeral, he suddenly felt dizziness, mild facial asymmetry, body shaking, sweaty, unsteady gait, symptoms lasted about 30 days and then gradually improved, he was taken by ambulance to [facility], and he had a stroke, but he was not sure whether he had a echocardiogram ultrasound of carotid artery or not, he was taking aspirin discharge home was Plavix, but he has run out of his Plavix now.  I was able to personally review MRI of the brain in 2018, in May 2019, 2 small subcentimeter acute to early subacute small vessel infarction involving the left periventricular white matter frontal coronary radiata, stable chronic supratentorium small vessel disease. He now continue complains of left shoulder pain, left arm weakness, mildly unsteady gait.  UPDATE 10/21/2019CM Maurice Sullivan, 73 year old male returns for follow-up with history of stroke in May 2019.  He [facility].  He is currently on Plavix daily without further stroke or TIA symptoms and minimal bruising no bleeding.  In addition he is on Lipitor without myalgias.  Blood pressure in the office today 146/78.  He remains on 3 blood pressure medications.  He continues to have problems with his left shoulder, he is [facility].   He claims he has had a couple of injections.  2D echo with EF 50 to 55%.  Doppler studies done right carotid 40 to 59% stenosis left 1 to 39% stenosis in July 2019.  He continues to smoke but is going to try to quit with Chantix.  He does walk daily for exercise.  Appetite is good and he is sleeping well.  He returns for reevaluation  Virtual Visit via Telephone  I connected with Maurice Sullivan on 07/13/18 at  by telephone and verified that I am speaking with the correct person using two identifiers.   I discussed the limitations, risks, security and privacy concerns of performing an evaluation and management service by telephone and the availability of in person appointments. I also discussed with the patient that there may be a patient responsible charge related to this service. The patient expressed understanding and agreed to proceed.   History of Present Illness: He was interviewed with his wife, patient is overall fairly stable, ambulate with cane, complains of dizziness when getting up quickly, has been compliant with his medication including Plavix 75 mg daily  Reviewed MRI of brain report [facility] on Jul 24, 2017, generalized atrophy, chronic small vessel disease in periventricular deep white matter, pons, superimposed to remote lacunar infarction within bilateral basal ganglion, but hemisphere white matter, right cerebellar old infarction.  Left corona radiata 8 mm acute stroke  Echocardiogram July 2019, ejection fraction 50 to 55%, wall motion was normal  Ultrasound of carotid artery: Right carotid artery 40 to 59% stenosis, left side less  than 39% stenosis.    Observations/Objective: I have reviewed problem lists, medications, allergies.  Assessment and Plan: History of stroke  Vascular risk factors of age, hypertension, hyperlipidemia  Continue Plavix daily, refill prescription was provided    Follow Up Instructions:   Follow-up with his primary care  physician,   I discussed the assessment and treatment plan with the patient. The patient was provided an opportunity to ask questions and all were answered. The patient agreed with the plan and demonstrated an understanding of the instructions.   The patient was advised to call back or seek an in-person evaluation if the symptoms worsen or if the condition fails to improve as anticipated.  I provided 21 minutes of non-face-to-face time during this encounter.   Levert Feinstein, MD

## 2019-04-01 IMAGING — CT CT CHEST W/O CM
2 of 3 series · 15 of 36 positions shown, 18 images · non-contrast
Comparison: Chest CT 10/27/2010.

CLINICAL DATA: 72-year-old male with history of chest pain for the
past 3 days. Dyspnea.

EXAM:
CT CHEST WITHOUT CONTRAST
TECHNIQUE: Multidetector CT imaging of the chest was performed following the
standard protocol without IV contrast.

[Series 2: thorax · axial · 0.64mm/px · z∈[-732,-452]mm · 12 of 166 slices shown, 15 images]
[im 13/166  mediastinal]
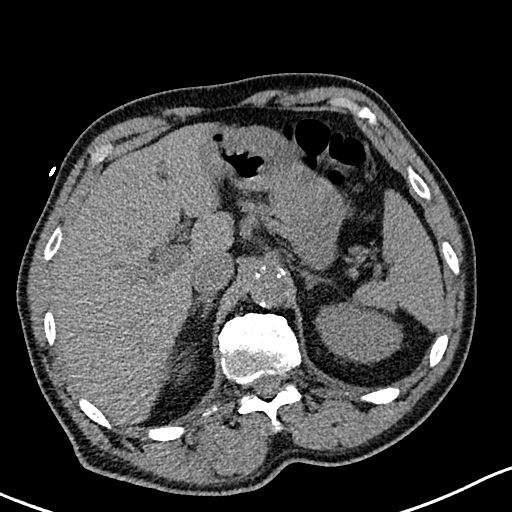
[im 13/166  lung]
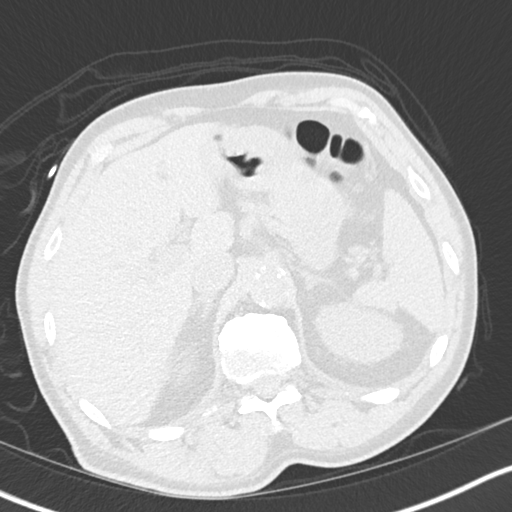
[im 25/166  lung]
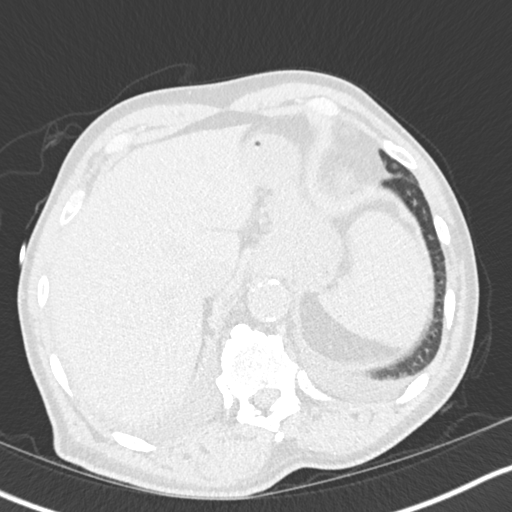
[im 37/166  lung]
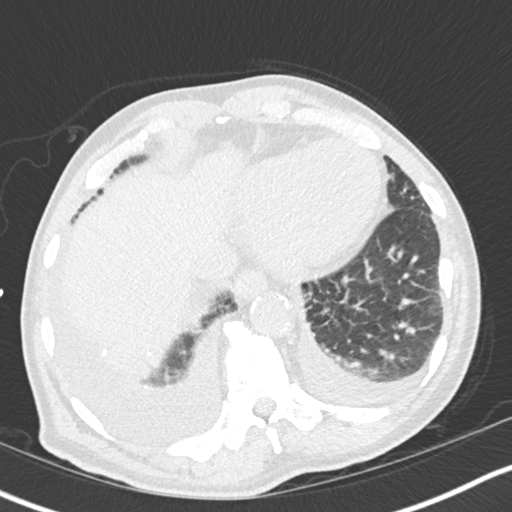
[im 49/166  lung]
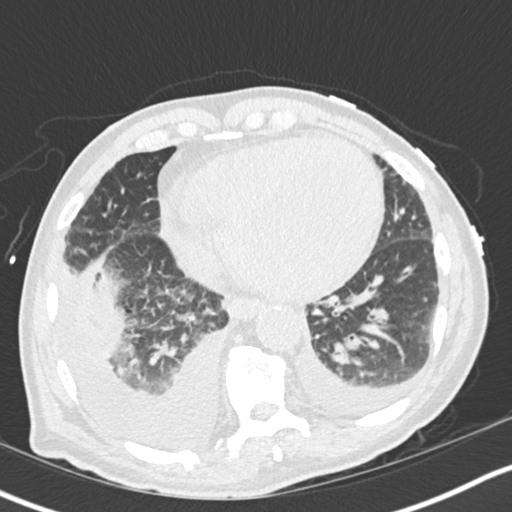
[im 62/166  mediastinal]
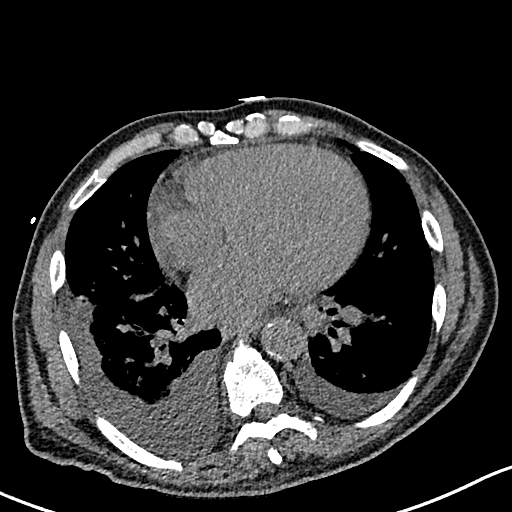
[im 62/166  lung]
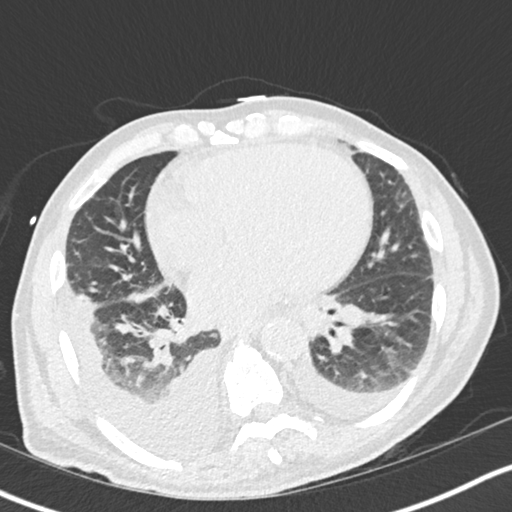
[im 74/166  lung]
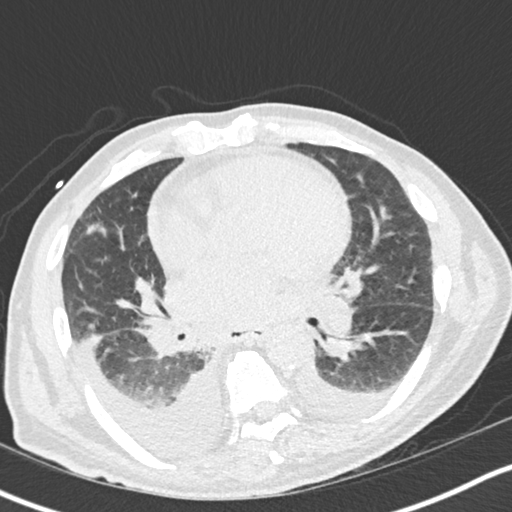
[im 92/166  lung]
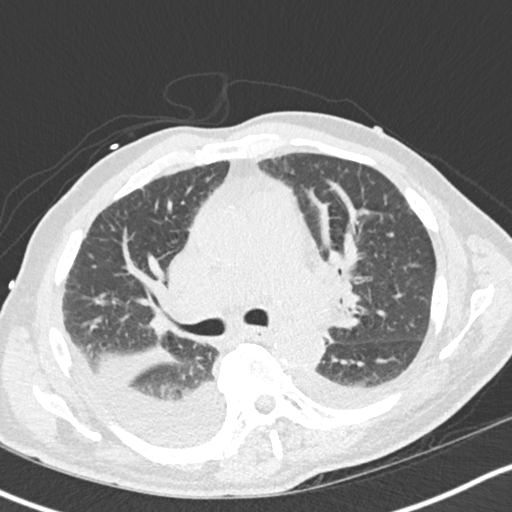
[im 104/166  lung]
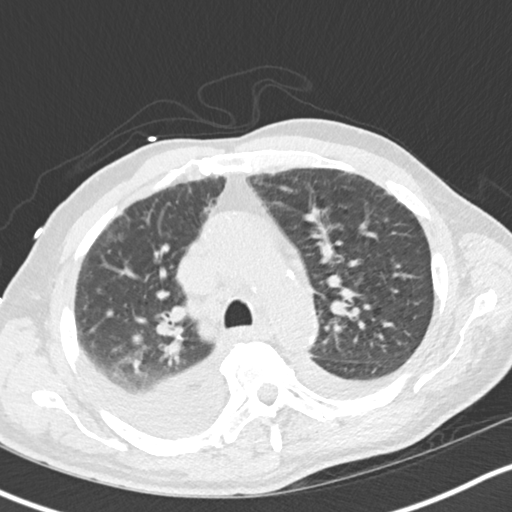
[im 117/166  mediastinal]
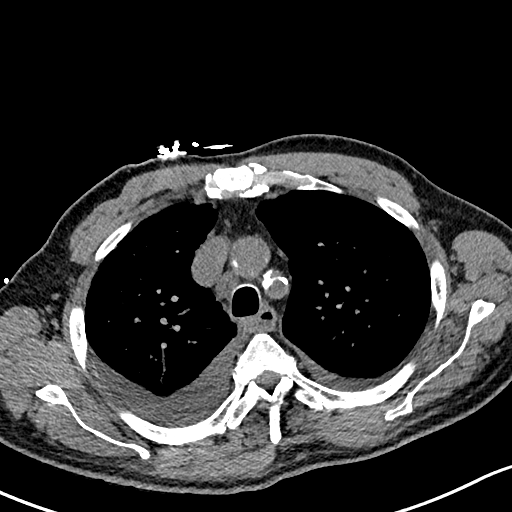
[im 117/166  lung]
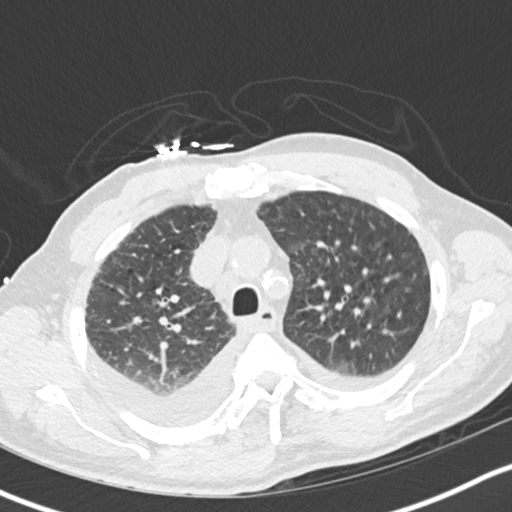
[im 129/166  lung]
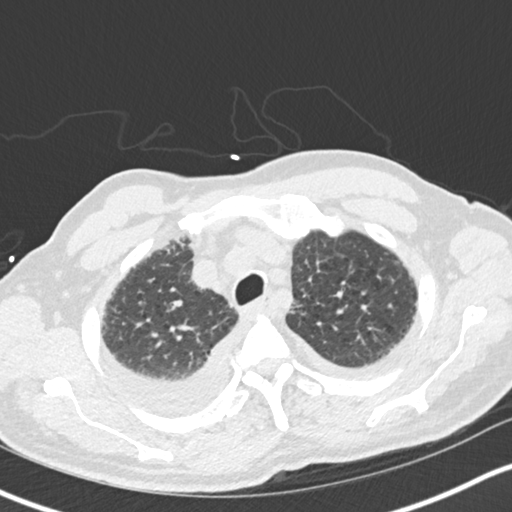
[im 141/166  lung]
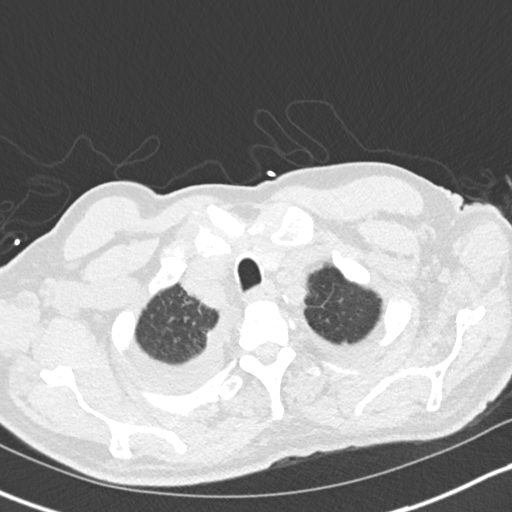
[im 153/166  lung]
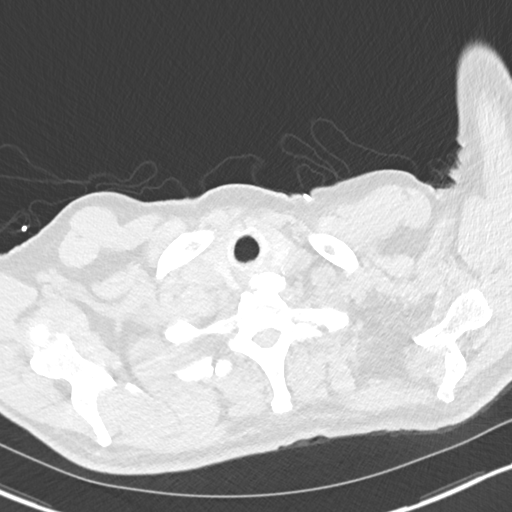

[Series 5: coronal · coronal · 0.67mm/px · 3 of 130 slices shown]
[im 26/130  lung]
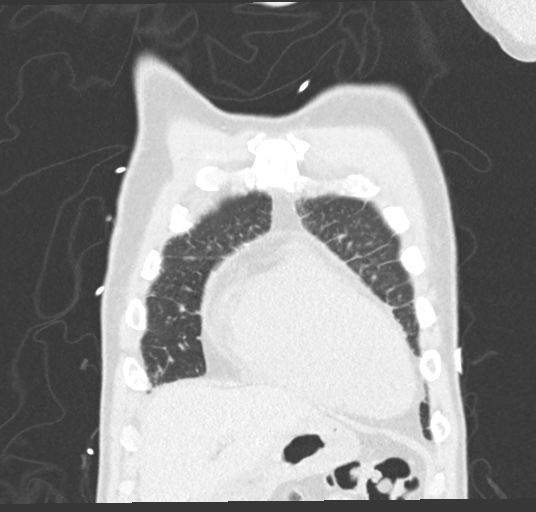
[im 52/130  lung]
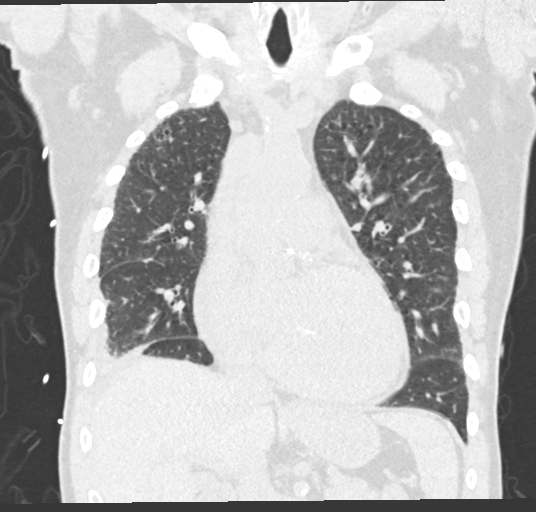
[im 78/130  lung]
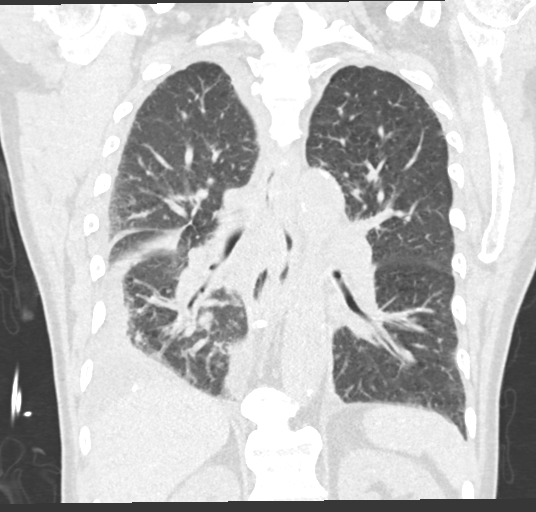

[15 of 36 positions shown; findings below may reference images not displayed]

FINDINGS: Cardiovascular: Heart size is mildly enlarged. There is no
significant pericardial fluid, thickening or pericardial
calcification. There is aortic atherosclerosis, as well as
atherosclerosis of the great vessels of the mediastinum and the
coronary arteries, including calcified atherosclerotic plaque in the
left main, left anterior descending and right coronary arteries.

Mediastinum/Nodes: Multiple prominent borderline enlarged
mediastinal and hilar lymph nodes are noted, nonspecific. Esophagus
is unremarkable in appearance. No axillary lymphadenopathy.

Lungs/Pleura: Moderate right and small left pleural effusions. Some
associated passive atelectasis in the right lung base. No acute
consolidative airspace disease. No pleural effusions. Subpleural
nodule measuring 7 x 5 mm (mean diameter of 6 mm) in the right
middle lobe associated with the minor fissure, similar to the prior
examination, considered benign (presumably a subpleural lymph node).
No other definite suspicious appearing pulmonary nodules or masses
are noted. Mild diffuse interlobular septal thickening, favored to
reflect mild interstitial pulmonary edema. Diffuse bronchial wall
thickening with mild centrilobular and paraseptal emphysema.

Upper Abdomen: Aortic atherosclerosis.

Musculoskeletal: There are no aggressive appearing lytic or blastic
lesions noted in the visualized portions of the skeleton.
IMPRESSION: 1. Moderate right and small left pleural effusions with evidence of
very mild interstitial pulmonary edema in the lungs. Given the
presence of mild cardiomegaly, findings are favored to reflect
congestive heart failure.
2. Aortic atherosclerosis, in addition to left main and 2 vessel
coronary artery disease. Assessment for potential risk factor
modification, dietary therapy or pharmacologic therapy may be
warranted, if clinically indicated.
3. Mild diffuse bronchial thickening with mild centrilobular and
paraseptal emphysema; imaging findings suggestive of underlying
COPD.

Aortic Atherosclerosis ([id number]-[id number].P) and Emphysema ([id number]-YX8.2).

## 2019-04-25 DEATH — deceased
